# Patient Record
Sex: Male | Born: 1935
Health system: Southern US, Community
[De-identification: ages and names within clinical notes are randomized; demographics above are authoritative.]

## PROBLEM LIST (undated history)

## (undated) DIAGNOSIS — N320 Bladder-neck obstruction: Secondary | ICD-10-CM

## (undated) DIAGNOSIS — R399 Unspecified symptoms and signs involving the genitourinary system: Secondary | ICD-10-CM

## (undated) DIAGNOSIS — Z8679 Personal history of other diseases of the circulatory system: Secondary | ICD-10-CM

## (undated) DIAGNOSIS — E785 Hyperlipidemia, unspecified: Secondary | ICD-10-CM

## (undated) DIAGNOSIS — I10 Essential (primary) hypertension: Secondary | ICD-10-CM

## (undated) DIAGNOSIS — Z860100 Personal history of colon polyps, unspecified: Secondary | ICD-10-CM

## (undated) DIAGNOSIS — N183 Chronic kidney disease, stage 3 unspecified: Secondary | ICD-10-CM

## (undated) DIAGNOSIS — N400 Enlarged prostate without lower urinary tract symptoms: Secondary | ICD-10-CM

## (undated) DIAGNOSIS — Z8719 Personal history of other diseases of the digestive system: Secondary | ICD-10-CM

## (undated) DIAGNOSIS — Z9289 Personal history of other medical treatment: Secondary | ICD-10-CM

## (undated) DIAGNOSIS — Z87448 Personal history of other diseases of urinary system: Secondary | ICD-10-CM

## (undated) DIAGNOSIS — R3912 Poor urinary stream: Secondary | ICD-10-CM

## (undated) DIAGNOSIS — Z87442 Personal history of urinary calculi: Secondary | ICD-10-CM

## (undated) DIAGNOSIS — M51369 Other intervertebral disc degeneration, lumbar region without mention of lumbar back pain or lower extremity pain: Secondary | ICD-10-CM

## (undated) DIAGNOSIS — Z8711 Personal history of peptic ulcer disease: Secondary | ICD-10-CM

## (undated) DIAGNOSIS — M5136 Other intervertebral disc degeneration, lumbar region: Secondary | ICD-10-CM

## (undated) HISTORY — PX: CATARACT EXTRACTION W/ INTRAOCULAR LENS  IMPLANT, BILATERAL: SHX1307

## (undated) HISTORY — PX: COLONOSCOPY: SHX174

## (undated) HISTORY — PX: PROSTATE SURGERY: SHX751

---
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
## 1959-05-26 HISTORY — PX: ADENOIDECTOMY: SHX5191

## 1963-09-25 HISTORY — PX: NASAL SEPTUM SURGERY: SHX37

## 2004-09-20 ENCOUNTER — Ambulatory Visit: Payer: Self-pay | Admitting: Urology

## 2004-09-20 ENCOUNTER — Other Ambulatory Visit: Payer: Self-pay

## 2004-09-24 HISTORY — PX: HEMORRHOID SURGERY: SHX153

## 2004-09-27 ENCOUNTER — Ambulatory Visit: Payer: Self-pay | Admitting: Urology

## 2005-02-23 ENCOUNTER — Ambulatory Visit: Payer: Self-pay | Admitting: Gastroenterology

## 2005-11-05 ENCOUNTER — Ambulatory Visit: Payer: Self-pay | Admitting: Internal Medicine

## 2006-03-20 ENCOUNTER — Ambulatory Visit: Payer: Self-pay | Admitting: *Deleted

## 2006-03-28 ENCOUNTER — Ambulatory Visit: Payer: Self-pay | Admitting: *Deleted

## 2006-05-01 ENCOUNTER — Ambulatory Visit (HOSPITAL_COMMUNITY): Admission: RE | Admit: 2006-05-01 | Discharge: 2006-05-01 | Payer: Self-pay | Admitting: *Deleted

## 2006-05-06 ENCOUNTER — Ambulatory Visit: Payer: Self-pay | Admitting: *Deleted

## 2006-05-08 ENCOUNTER — Inpatient Hospital Stay (HOSPITAL_COMMUNITY): Admission: RE | Admit: 2006-05-08 | Discharge: 2006-05-10 | Payer: Self-pay | Admitting: Urology

## 2006-05-08 ENCOUNTER — Encounter (INDEPENDENT_AMBULATORY_CARE_PROVIDER_SITE_OTHER): Payer: Self-pay | Admitting: *Deleted

## 2006-05-08 HISTORY — PX: TRANSURETHRAL RESECTION OF PROSTATE: SHX73

## 2007-12-03 ENCOUNTER — Ambulatory Visit: Payer: Self-pay | Admitting: Pain Medicine

## 2007-12-09 ENCOUNTER — Ambulatory Visit: Payer: Self-pay | Admitting: Pain Medicine

## 2007-12-16 ENCOUNTER — Ambulatory Visit: Payer: Self-pay | Admitting: Pain Medicine

## 2007-12-31 ENCOUNTER — Ambulatory Visit: Payer: Self-pay | Admitting: Physician Assistant

## 2008-05-24 IMAGING — NM NM KIDNEY FUNCTION AND FLOW
2 series · 12 of 12 positions shown · non-contrast
Comparison: none

REASON FOR EXAM: renal artery stenosis
COMMENTS:

[Series 1000: renal · 7.79mm/px · 6 of 60 frames shown]
[frame 6/60]
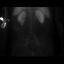
[frame 16/60]
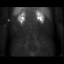
[frame 26/60]
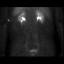
[frame 36/60]
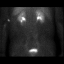
[frame 46/60]
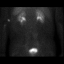
[frame 56/60]
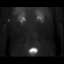

[Series 1000: renal (results) · 7.79mm/px · 6 of 60 frames shown]
[frame 6/60]
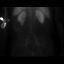
[frame 16/60]
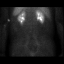
[frame 26/60]
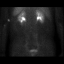
[frame 36/60]
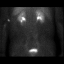
[frame 46/60]
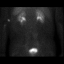
[frame 56/60]
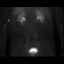

[12 of 12 positions shown; findings below may reference images not displayed]

PROCEDURE:     NM  - NM KIDNEY SCAN W  VASCULAR FLOW  - March 28, 2006 [DATE]

RESULT:     Following injection of 15.5 mCi of technetium-33m DTPA, renal
scan with renal function determination was performed. The renal scan shows
prompt appearance of maximum tracer in each kidney in less than 1 minute.
The washout curves bilaterally are flattened, which is compatible with
diminished renal function or bilateral stasis.  Split renal function
determination shows 40.6% of total renal function on the LEFT and 59.4% on
the RIGHT.
IMPRESSION: 1)Please see above.

## 2009-05-27 ENCOUNTER — Encounter: Admission: RE | Admit: 2009-05-27 | Discharge: 2009-05-27 | Payer: Self-pay | Admitting: Neurosurgery

## 2009-09-08 ENCOUNTER — Encounter: Admission: RE | Admit: 2009-09-08 | Discharge: 2009-09-08 | Payer: Self-pay | Admitting: Neurosurgery

## 2010-02-07 ENCOUNTER — Encounter: Admission: RE | Admit: 2010-02-07 | Discharge: 2010-02-07 | Payer: Self-pay | Admitting: Neurosurgery

## 2010-06-01 ENCOUNTER — Encounter: Admission: RE | Admit: 2010-06-01 | Discharge: 2010-06-01 | Payer: Self-pay | Admitting: Neurosurgery

## 2010-09-21 ENCOUNTER — Encounter
Admission: RE | Admit: 2010-09-21 | Discharge: 2010-09-21 | Payer: Self-pay | Source: Home / Self Care | Attending: Neurosurgery | Admitting: Neurosurgery

## 2011-02-09 NOTE — H&P (Signed)
Stephen, Wheeler                ACCOUNT NO.:  [id number]   MEDICAL RECORD NO.:  [id number]          PATIENT TYPE:  INP   LOCATION:  1420                         FACILITY[facility]  [facility]   PHYSICIAN:  Maretta Bees. Vonita Moss, M.D.DATE OF BIRTH:  05-11-1935   DATE OF ADMISSION:  05/08/2006  DATE OF DISCHARGE:                                HISTORY & PHYSICAL   HISTORY OF PRESENT ILLNESS:  This 76 year old gentleman has had problems  with stones and bladder outlet obstruction symptoms for years and has been  on Proscar and terazosin.  He has bleeding sometimes when he rides the  lawnmower.  He had a TUNA of his prostate by Dr. Virl Diamond in [place].  He  came to me for further evaluation.   He underwent office cystoscopy and was found to have a lot of calcifications  in the prostatic mucosa.  Initial stone analysis showed calcium phosphate  dihydrate with some 2,8-dihydroxyadenine which could be an enzymatic effect  but a repeat stone analysis did not show that, it just showed some urates  and proteinaceous matter in addition to the calcium hydrogen phosphate  dihydrate.   Because of his chronic voiding symptoms, bleeding and stones, he is admitted  for TUR of the prostate.  Fortunately, he had a normal PSA earlier this year  at 1.08 and doubling it, which make it 2.16.  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   MEDICAL HISTORY:  1. Mild hypertension.  2. Occasional irregular heartbeat.   PREVIOUS SURGERY:  1. TUNA of the prostate.  2. Appendectomy.  3. Hemorrhoidectomy.  4. Nasal septoplasty.   He had some recent creatinine elevation and had an angiogram that apparently  showed normal vascular flow.   MEDICATIONS:  1. 25 mg of Toprol.  2. 25 mg of __________.  3. 5 mg of terazosin.  4. 100 mg of Tramadol for p.r.n. arthritis.  5. 81 mg aspirin.  6. Multivitamins.  7. Glucosamine.   ALLERGIES TO DRUGS:  Denied.   SOCIAL  HISTORY:  He does not smoke or drink alcohol.   FAMILY HISTORY:  Noncontributory.   REVIEW OF SYSTEMS:  Noted by me and initialed on health history form.   PHYSICAL EXAMINATION:  VITAL SIGNS:  Blood pressure on admission was 134/78.  Pulse of 64.  Temperature of 98.7.  GENERAL:  He is alert and oriented.  SKIN:  Warm and dry.  In no acute distress.  HEART:  Heart tones are regular.  CHEST:  Clear.  BACK:  No CVA or bladder tenderness.  ABDOMEN:  No hepatosplenomegaly.  No hernias or inguinal nodes.  Exam of  genitalia show no urethral, penile, scrotal or epididymal lesions,but did  reveal an atrophic right testicle.  RECTAL:  Exam deferred.   IMPRESSION:  1. Bladder neck contracture.  2. Prostatic calculi.  3. Left renal cyst.  4. Right renal angiomyolipoma.  5. Atrophic right testicle.  6. Hypertension.  7. Mild renal insufficiency.  8. History of cardiac irregularity.      Maretta Bees. Vonita Moss, M.D.  Electronically Signed     LJP/MEDQ  D:  05/09/2006  T:  05/09/2006  Job:  [id number]

## 2011-02-09 NOTE — Op Note (Signed)
[name], [name]                ACCOUNT NO.:  [id number]   MEDICAL RECORD NO.:  [id number]          PATIENT TYPE:  INP   LOCATION:  1420                         FACILITY:  [facility]   PHYSICIAN:  Maretta Bees. Vonita Moss, M.D.DATE OF BIRTH:  [birth year]   DATE OF PROCEDURE:  05/08/2006  DATE OF DISCHARGE:                                 OPERATIVE REPORT   PREOPERATIVE DIAGNOSIS:  Bladder neck contracture and bladder calculi.   POSTOPERATIVE DIAGNOSIS:  Bladder neck contracture and bladder calculi.   PROCEDURE:  Transurethral resection of the prostate and EHL of prostatic  calculi.   SURGEON:  Dr. Larey Dresser.   ANESTHESIA:  General.   INDICATIONS:  This gentleman presents with a history of recurrent bladder  irritability, and a previous TUNA procedure at an outside facility, and has  had some persistent pyuria.  Cystoscopy in the office revealed a lot of  prostatic calcifications, and he is brought to the OR today for evaluation  and therapy of residual obstructive tissue and prostatic calculi.   PROCEDURE:  The patient was brought to the operating room and placed in  lithotomy position.  External genitalia were prepped and draped in the usual  fashion.  He was sounded from 18 to 30-French.  Resectoscope sheath was  inserted.  Prostate had a lot of mucosal calculi, especially on the left  side.  Some were quite large and were manipulated free into the bladder.  The bladder itself had no stones, tumors, inflammatory lesions.  I then  resected the residual prostatic tissue bilaterally, and there was mainly  tissue on the anterior wall and the right wall, but both sides were taken  down to capsule.  A lot of calcifications embedded in the bladder neck were  resected and freed up.  At the conclusion of the procedure the prostate was  well resected with no residual calculi in the prostatic urethra, but after  removing the chips from the bladder, the residual stone fragments in the  bladder, that had been in the prostate, required EHL to fragment them in  smaller pieces so they could be washed out through the resectoscope sheath.  After that was complete there was no evidence of residual chips or stones in  the bladder, ureteral orifices were intact, prostatic urethra had good  hemostasis, estimated blood loss was 100 cc.  As I removed the scope, the  external sphincter was seen to be intact, and then a 24-French, 30-cc Foley  was inserted and connected to closed drainage with clear irrigation.  Catheter was put on traction and he was sent to the recovery room in good  condition, having tolerated the procedure well.      Maretta Bees. Vonita Moss, M.D.  Electronically Signed     LJP/MEDQ  D:  05/08/2006  T:  05/08/2006  Job:  [id number]

## 2011-02-09 NOTE — Discharge Summary (Signed)
[name], [name]                ACCOUNT NO.:  [id number]   MEDICAL RECORD NO.:  [id number]          PATIENT TYPE:  INP   LOCATION:  1420                         FACILITY:  [facility]   PHYSICIAN:  Maretta Bees. Vonita Moss, M.D.DATE OF BIRTH:  1935-03-06   DATE OF ADMISSION:  05/08/2006  DATE OF DISCHARGE:  05/10/2006                                 DISCHARGE SUMMARY   FINAL DIAGNOSES:  1. Bladder neck contracture.  2. Prostatic calculi.  3. Left renal cyst.  4. Right renal angiomyelolipoma.  5. Atrophic right testicle.  6. Hypertension.  7. Mild renal insufficiency.  8. Cardiac irregularity.   PROCEDURE:  Transurethral resection of prostate and EHL of prostatic calculi  on May 08, 2006.   HISTORY:  This 76 year old gentleman has had problems with bladder outlet  obstruction and has been on Proscar and terazosin and has had a TUNA  procedure in [place]. He has been passing stones. Cystoscopy revealed a  partially obstructed prostate with a lot of prostatic calcifications that I  believe are the cause for bleeding and voiding dysfunction. He is brought to  the hospital for a TUR of the prostate and removal of these prostate  calculi. He is in general good health except for well-controlled mild  hypertension and occasional irregular heartbeat. Physical examination is  noncontributory.   HOSPITAL COURSE:  He was admitted after undergoing the procedure as noted  above. His postoperative course was benign and he did have some bloody  urine, initially some bladder spasms but the urine started clearing. His  Foley was removed on May 10, 2006 and he voided satisfactorily after that  and was ready for discharge. He will go home on his usual medications of 25  mg of metoprolol, 5 mg finasteride, 5 mg terazosin, 100 mg tramadol at  bedtime, 81 mg aspirin which he can start in a few days. He will also go  home on a 1/2 gram of Cipro b.i.d. for 5 days. He will have limited activity  for a  month. He will resume diet as tolerated. He was sent home in good  condition. He will return to the office in 2 weeks for followup.      Maretta Bees. Vonita Moss, M.D.  Electronically Signed     LJP/MEDQ  D:  05/10/2006  T:  05/10/2006  Job:  [id number]

## 2011-06-14 ENCOUNTER — Other Ambulatory Visit: Payer: Self-pay | Admitting: Internal Medicine

## 2011-06-14 DIAGNOSIS — M5136 Other intervertebral disc degeneration, lumbar region: Secondary | ICD-10-CM

## 2011-06-18 ENCOUNTER — Ambulatory Visit
Admission: RE | Admit: 2011-06-18 | Discharge: 2011-06-18 | Disposition: A | Discharge status: Home / Self Care | Payer: No Typology Code available for payment source | Source: Ambulatory Visit | Attending: Internal Medicine | Admitting: Internal Medicine

## 2011-06-18 DIAGNOSIS — M5136 Other intervertebral disc degeneration, lumbar region: Secondary | ICD-10-CM

## 2011-06-18 MED ORDER — METHYLPREDNISOLONE ACETATE 40 MG/ML INJ SUSP (RADIOLOG
120.0000 mg | Freq: Once | INTRAMUSCULAR | Status: AC
  Administered 2011-06-18: 120 mg via EPIDURAL

## 2011-06-18 MED ORDER — IOHEXOL 180 MG/ML  SOLN
1.0000 mL | Freq: Once | INTRAMUSCULAR | Status: AC | PRN
  Administered 2011-06-18: 1 mL via EPIDURAL

## 2012-02-23 HISTORY — PX: CATARACT EXTRACTION W/ INTRAOCULAR LENS  IMPLANT, BILATERAL: SHX1307

## 2012-05-19 ENCOUNTER — Encounter: Payer: Self-pay | Admitting: Rheumatology

## 2012-05-25 ENCOUNTER — Encounter: Payer: Self-pay | Admitting: Rheumatology

## 2012-06-24 ENCOUNTER — Encounter: Payer: Self-pay | Admitting: Rheumatology

## 2012-08-12 ENCOUNTER — Other Ambulatory Visit: Payer: Self-pay | Admitting: Neurosurgery

## 2012-08-12 DIAGNOSIS — IMO0002 Reserved for concepts with insufficient information to code with codable children: Secondary | ICD-10-CM

## 2012-08-16 ENCOUNTER — Ambulatory Visit
Admission: RE | Admit: 2012-08-16 | Discharge: 2012-08-16 | Disposition: A | Discharge status: Home / Self Care | Payer: Medicare Other | Source: Ambulatory Visit | Attending: Neurosurgery | Admitting: Neurosurgery

## 2012-08-16 DIAGNOSIS — IMO0002 Reserved for concepts with insufficient information to code with codable children: Secondary | ICD-10-CM

## 2012-09-03 ENCOUNTER — Encounter: Payer: Self-pay | Admitting: Neurosurgery

## 2012-09-24 ENCOUNTER — Encounter: Payer: Self-pay | Admitting: Neurosurgery

## 2013-01-28 ENCOUNTER — Ambulatory Visit: Payer: Self-pay | Admitting: Surgery

## 2013-01-29 LAB — PATHOLOGY REPORT

## 2014-01-05 DIAGNOSIS — M76899 Other specified enthesopathies of unspecified lower limb, excluding foot: Secondary | ICD-10-CM | POA: Insufficient documentation

## 2014-01-05 DIAGNOSIS — I10 Essential (primary) hypertension: Secondary | ICD-10-CM | POA: Insufficient documentation

## 2014-07-07 DIAGNOSIS — H81319 Aural vertigo, unspecified ear: Secondary | ICD-10-CM | POA: Insufficient documentation

## 2014-07-07 DIAGNOSIS — M5116 Intervertebral disc disorders with radiculopathy, lumbar region: Secondary | ICD-10-CM | POA: Insufficient documentation

## 2014-07-07 DIAGNOSIS — E785 Hyperlipidemia, unspecified: Secondary | ICD-10-CM | POA: Insufficient documentation

## 2014-10-27 DIAGNOSIS — M5136 Other intervertebral disc degeneration, lumbar region: Secondary | ICD-10-CM | POA: Insufficient documentation

## 2015-01-14 NOTE — Op Note (Signed)
PATIENT NAME:  [name], [name] MR#:  [id number] DATE OF BIRTH:  Jun 08, 1935  DATE OF PROCEDURE:  01/28/2013  PREOPERATIVE DIAGNOSES: History of rectal bleeding, hemorrhoids, constipation.   POSTOPERATIVE DIAGNOSES: History of rectal bleeding, hemorrhoids, constipation; colonic polyp.   PROCEDURE: Colonoscopy and polypectomy.   SURGEON: Rochel Brome, M.D.   ANESTHESIA: Monitored anesthesia care with intravenous sedation.   The patient was placed on the operating table in the supine position and sedated, rolled up into the left lateral decubitus position. He was monitored with pulse oximetry, intermittent blood pressure recordings, and cardiac monitor, sedated by the anesthesia staff.   The video colonoscope was inserted through the rectum and manipulated up through the colon around to the ascending colon, and it appeared that the scope was entering the right lower quadrant. There was marked tortuosity of the colon and I did not identify the cecum that appeared to be in the ascending colon. There was a polyp encountered in the ascending colon which was partially pedunculated. It appeared that it may be about 8 or 9 mm in dimension. Initially a biopsy was taken and subsequently it was removed with snare and cautery. The colon preparation was good. Some bilious fluid was aspirated during the course of the procedure.   The scope was gradually pulled back, examining the colonic mucosa, seeing no other polyps or tumors. Scope was pulled back into the rectum and retroflexed. There were a few areas of hyperplasia and one internal hemorrhoid was identified. No large polyps. No blood. The scope was again straightened and suctioned out excess carbon dioxide. The scope was removed.   A digital anorectal exam demonstrated no palpable mass.   The patient appeared to be in satisfactory condition and was prepared for transfer to the recovery room.    ____________________________ Lenna Sciara. Rochel Brome,  MD jws:dm D: 01/28/2013 09:08:32 ET T: 01/28/2013 09:22:35 ET JOB#: [id number]  cc: Loreli Dollar, MD, <Dictator> Loreli Dollar MD ELECTRONICALLY SIGNED 01/29/2013 17:20

## 2015-01-28 DIAGNOSIS — J329 Chronic sinusitis, unspecified: Secondary | ICD-10-CM | POA: Diagnosis not present

## 2015-01-28 DIAGNOSIS — J029 Acute pharyngitis, unspecified: Secondary | ICD-10-CM | POA: Diagnosis not present

## 2015-01-28 DIAGNOSIS — B9689 Other specified bacterial agents as the cause of diseases classified elsewhere: Secondary | ICD-10-CM | POA: Diagnosis not present

## 2015-03-11 ENCOUNTER — Telehealth: Payer: Self-pay | Admitting: Family Medicine

## 2015-03-11 ENCOUNTER — Telehealth: Payer: Self-pay

## 2015-03-11 NOTE — Telephone Encounter (Signed)
Dr. Sharlet Salina office called back and notified me that he actually has [employer] although in our old system it was [employer]. I went in the [place] and pulled up his information and obtained authorization for pt to been seen by Dr. Sharlet Salina. Auth # [id number],  03/11/15 -09/07/15.  Once they receive records from [place] they'll call pt and schedule appt.

## 2015-03-11 NOTE — Telephone Encounter (Signed)
[facility] to get clarification on what exactly they needed and the receptionist stated that they just needed our NPI number and I gave our group NPI # and authorized 6 visits. They'll call [name] notify him that he may schedule his injections.

## 2015-03-11 NOTE — Telephone Encounter (Signed)
Pt was being referred to [facility]. They are requesting a note that states that the patient nned an Epideral Steriod Injection in the back for pain.

## 2015-04-05 ENCOUNTER — Ambulatory Visit (INDEPENDENT_AMBULATORY_CARE_PROVIDER_SITE_OTHER): Payer: Medicare Other | Admitting: Family Medicine

## 2015-04-05 ENCOUNTER — Encounter: Payer: Self-pay | Admitting: Family Medicine

## 2015-04-05 ENCOUNTER — Telehealth: Payer: Self-pay

## 2015-04-05 VITALS — BP 112/58 | HR 65 | Temp 98.4°F | Resp 16 | Ht 70.0 in | Wt 187.0 lb

## 2015-04-05 DIAGNOSIS — N401 Enlarged prostate with lower urinary tract symptoms: Secondary | ICD-10-CM | POA: Insufficient documentation

## 2015-04-05 DIAGNOSIS — B9689 Other specified bacterial agents as the cause of diseases classified elsewhere: Secondary | ICD-10-CM | POA: Insufficient documentation

## 2015-04-05 DIAGNOSIS — N183 Chronic kidney disease, stage 3 unspecified: Secondary | ICD-10-CM | POA: Insufficient documentation

## 2015-04-05 DIAGNOSIS — G4701 Insomnia due to medical condition: Secondary | ICD-10-CM | POA: Diagnosis not present

## 2015-04-05 DIAGNOSIS — M5136 Other intervertebral disc degeneration, lumbar region: Secondary | ICD-10-CM | POA: Diagnosis not present

## 2015-04-05 DIAGNOSIS — J069 Acute upper respiratory infection, unspecified: Secondary | ICD-10-CM

## 2015-04-05 DIAGNOSIS — I10 Essential (primary) hypertension: Secondary | ICD-10-CM | POA: Insufficient documentation

## 2015-04-05 DIAGNOSIS — M1990 Unspecified osteoarthritis, unspecified site: Secondary | ICD-10-CM | POA: Insufficient documentation

## 2015-04-05 DIAGNOSIS — Z23 Encounter for immunization: Secondary | ICD-10-CM | POA: Diagnosis not present

## 2015-04-05 DIAGNOSIS — G4700 Insomnia, unspecified: Secondary | ICD-10-CM | POA: Insufficient documentation

## 2015-04-05 DIAGNOSIS — E785 Hyperlipidemia, unspecified: Secondary | ICD-10-CM | POA: Diagnosis not present

## 2015-04-05 DIAGNOSIS — Z0000 Encounter for general adult medical examination without abnormal findings: Secondary | ICD-10-CM

## 2015-04-05 DIAGNOSIS — J029 Acute pharyngitis, unspecified: Secondary | ICD-10-CM | POA: Insufficient documentation

## 2015-04-05 DIAGNOSIS — Z87898 Personal history of other specified conditions: Secondary | ICD-10-CM | POA: Insufficient documentation

## 2015-04-05 DIAGNOSIS — M6530 Trigger finger, unspecified finger: Secondary | ICD-10-CM | POA: Insufficient documentation

## 2015-04-05 MED ORDER — ALPRAZOLAM 0.5 MG PO TABS: 0.5000 mg | ORAL_TABLET | Freq: Every evening | ORAL | Status: DC | PRN

## 2015-04-05 MED ORDER — TAMSULOSIN HCL 0.4 MG PO CAPS: 0.4000 mg | ORAL_CAPSULE | Freq: Every day | ORAL | Status: DC

## 2015-04-05 MED ORDER — LISINOPRIL 20 MG PO TABS: 20.0000 mg | ORAL_TABLET | Freq: Every day | ORAL | Status: DC

## 2015-04-05 MED ORDER — TRAMADOL HCL 50 MG PO TABS: 100.0000 mg | ORAL_TABLET | Freq: Three times a day (TID) | ORAL | Status: AC | PRN

## 2015-04-05 NOTE — Addendum Note (Signed)
Addended by: Bobetta Lime on: 04/05/2015 01:48 PM   Modules accepted: Level of Service, SmartSet

## 2015-04-05 NOTE — Progress Notes (Signed)
Name: [name]   MRN: [id number]    DOB: 06/07/1935   Date:04/05/2015       Progress Note  Subjective  Chief Complaint  Chief Complaint  Patient presents with  . Annual Exam  . Medication Refill  . Fatigue    HPI  Patient is here today for a Male Medicare Wellness Visit:  The patient has been in otherwise good general health and voices some concerns regarding tendency for low BPs during hot summer months. Also notes fatigue from his tamsulosin. Planning on returning to urologist (near [place]) to discuss prostate procedure this Fall. Needs refills of all of his medications sent to mail order pharmacy.  Hypertension: Patient is here for evaluation of low blood pressures.  Age at onset of elevated blood pressure:  many decades ago.Cardiac symptoms fatigue. Patient denies chest pain, chest pressure/discomfort, dyspnea, irregular heart beat, lower extremity edema, near-syncope, palpitations and tachypnea.  Cardiovascular risk factors: advanced age (older than 9 for men, 19 for women), hypertension and male gender. Use of agents associated with hypertension: NSAIDS. History of target organ damage: none.    Joint/Muscle Pain: Patient complains of arthralgias for which has been present for several years. Pain is located in back, is described as aching, and is intermittent, moderate .  Associated symptoms include: none.  The patient has been using ibuprofen 600mg  po up to tid prn. Also prescribed tramadol 50mg  2 po q8hrs.  Related to injury:   no.      Past Medical History  Diagnosis Date  . Hypertension, goal below 150/90   . Benign localized prostatic hyperplasia with lower urinary tract symptoms (LUTS)   . Insomnia due to medical condition   . Trigger finger of right hand   . Degeneration of intervertebral disc of lumbar region     Past Surgical History  Procedure Laterality Date  . Appendectomy  1957  . Rhinoplasty  1965  . Hemorrhoid surgery      patient states he had  this about 9-10 years ago  . Prostate surgery      Family History  Problem Relation Age of Onset  . Cancer Sister     breast  . Cancer Brother     prostate    History   Social History  . Marital Status: Married    Spouse Name: N/A  . Number of Children: 1  . Years of Education: N/A   Occupational History  . retired    Social History Main Topics  . Smoking status: Former Research scientist (life sciences)  . Smokeless tobacco: Not on file  . Alcohol Use: No  . Drug Use: No  . Sexual Activity: No   Other Topics Concern  . Not on file   Social History Narrative  . No narrative on file     Current outpatient prescriptions:  .  ALPRAZolam (XANAX) 0.5 MG tablet, Take by mouth., Disp: , Rfl:  .  aspirin 81 MG chewable tablet, Chew by mouth., Disp: , Rfl:  .  BOSWELLIA-GLUCOSAMINE-VIT D PO, Take by mouth., Disp: , Rfl:  .  Fish Oil-Cholecalciferol (FISH OIL + D3 PO), , Disp: , Rfl:  .  Misc Natural Products (SAW [place]METTO) CAPS, Take by mouth., Disp: , Rfl:  .  tamsulosin (FLOMAX) 0.4 MG CAPS capsule, Take by mouth., Disp: , Rfl:  .  traMADol (ULTRAM) 50 MG tablet, Take by mouth., Disp: , Rfl:  .  triamterene-hydrochlorothiazide (MAXZIDE-25) 37.5-25 MG per tablet, Take by mouth., Disp: , Rfl:  No Known Allergies  Fall Risk: Fall Risk  04/05/2015  Falls in the past year? Yes  Number falls in past yr: 2 or more  Injury with Fall? No  Follow up Education provided;Falls prevention discussed;Falls evaluation completed    Depression screen [facility] 2/9 04/05/2015  Decreased Interest 0  Down, Depressed, Hopeless 0  PHQ - 2 Score 0   Functional Status Survey: Is the patient deaf or have difficulty hearing?: No Does the patient have difficulty seeing, even when wearing glasses/contacts?: No Does the patient have difficulty concentrating, remembering, or making decisions?: No Does the patient have difficulty walking or climbing stairs?: No Does the patient have difficulty dressing or bathing?:  No Does the patient have difficulty doing errands alone such as visiting a doctor's office or shopping?: No   ROS  CONSTITUTIONAL: No significant weight changes, fever, chills, weakness or fatigue.  HEENT:  - Eyes: No visual changes.  - Ears: No auditory changes. No pain.  - Nose: No sneezing, congestion, runny nose. - Throat: No sore throat. No changes in swallowing. SKIN: No rash or itching.  CARDIOVASCULAR: No chest pain, chest pressure or chest discomfort. No palpitations or edema.  RESPIRATORY: No shortness of breath, cough or sputum.  GASTROINTESTINAL: No anorexia, nausea, vomiting. No changes in bowel habits. No abdominal pain or blood.  GENITOURINARY: No dysuria. No frequency. No discharge.  NEUROLOGICAL: No headache, dizziness, syncope, paralysis, ataxia, numbness or tingling in the extremities. No memory changes. No change in bowel or bladder control.  MUSCULOSKELETAL: No joint pain. No muscle pain. HEMATOLOGIC: No anemia, bleeding or bruising.  LYMPHATICS: No enlarged lymph nodes.  PSYCHIATRIC: No change in mood. No change in sleep pattern.  ENDOCRINOLOGIC: No reports of sweating, cold or heat intolerance. No polyuria or polydipsia.   Objective  Filed Vitals:   04/05/15 0842  BP: 112/58  Pulse: 65  Temp: 98.4 F (36.9 C)  TempSrc: Oral  Resp: 16  Height: 5\' 10"  (1.778 m)  Weight: 187 lb (84.823 kg)  SpO2: 98%   Body mass index is 26.83 kg/(m^2).  Physical Exam  Constitutional: Patient appears well-developed and well-nourished. In no distress.  HEENT:  - Head: Normocephalic and atraumatic.  - Ears: Bilateral TMs gray, no erythema or effusion - Nose: Nasal mucosa moist - Mouth/Throat: Oropharynx is clear and moist. No tonsillar hypertrophy or erythema. No post nasal drainage.  - Eyes: Conjunctivae clear, EOM movements normal. PERRLA. No scleral icterus.  Neck: Normal range of motion. Neck supple. No JVD present. No thyromegaly present.  Cardiovascular:  Normal rate, regular rhythm and normal heart sounds.  No murmur heard.  Pulmonary/Chest: Effort normal and breath sounds normal. No respiratory distress. Musculoskeletal: Normal range of motion bilateral UE and LE, no joint effusions. Peripheral vascular: Bilateral LE no edema. Neurological: CN II-XII grossly intact with no focal deficits. Alert and oriented to person, place, and time. Coordination, balance, strength, speech and gait are normal.  Skin: Skin is warm and dry. No rash noted. Distributed actinic keratosis and sun spots.  Psychiatric: Patient has a normal mood and affect. Behavior is normal in office today. Judgment and thought content normal in office today.   Assessment & Plan  1. Annual physical exam Functional ability/safety issues: No Issues Hearing issues: Addressed  Activities of daily living: Discussed Home safety issues: No Issues  End Of Life Planning: Offered verbal information regarding advanced directives, healthcare power of attorney.  Preventative care, Health maintenance, Preventative health measures discussed.  Preventative screenings discussed today: lab  work, colonoscopy, PSA.  Men age 72 to 69 years if ever smoked recommended to get a one time AAA ultrasound screening exam.  Low Dose CT Chest recommended if Age 39-80 years, 30 pack-year currently smoking OR have quit w/in 15years.   Lifestyle risk factor issued reviewed: Diet, exercise, weight management, advised patient smoking is not healthy, nutrition/diet.  Preventative health measures discussed (5-10 year plan).  Reviewed and recommended vaccinations: - Pneumovax  - Prevnar  - Annual Influenza - Zostavax - Tdap   Depression screening: Done Fall risk screening: Done Discuss ADLs/IADLs: Done  Current medical providers: See HPI  Other health risk factors identified this visit: No other issues Cognitive impairment issues: None identified  All above discussed with patient. Appropriate  education, counseling and referral will be made based upon the above.   2. Chronic kidney disease (CKD), stage III (moderate) Due for blood work. Using NSAIDs chronically needs close monitoring.  - CBC with Differential/Platelet - Comprehensive metabolic panel  3. DDD (degenerative disc disease), lumbar Refilled, printed and will be faxed to mail order per his request.  - traMADol (ULTRAM) 50 MG tablet; Take 2 tablets (100 mg total) by mouth every 8 (eight) hours as needed for severe pain.  Dispense: 540 tablet; Refill: 2  4. Hypertension goal BP (blood pressure) < 150/90 Due to low BPs will stop triamterene-hctz and put him back on lisinopril 20mg  one a day (he was on this previously).  - lisinopril (PRINIVIL,ZESTRIL) 20 MG tablet; Take 1 tablet (20 mg total) by mouth daily.  Dispense: 90 tablet; Refill: 3 - CBC with Differential/Platelet - Comprehensive metabolic panel - TSH  5. Hyperlipidemia LDL goal <100 Due for lab work.  - Lipid panel  6. Insomnia due to medical condition Refilled  - ALPRAZolam (XANAX) 0.5 MG tablet; Take 1 tablet (0.5 mg total) by mouth at bedtime as needed for anxiety.  Dispense: 90 tablet; Refill: 2  7. Benign prostatic hypertrophy with lower urinary tract symptoms (LUTS) Continue care with Urology specialist  - tamsulosin (FLOMAX) 0.4 MG CAPS capsule; Take 1 capsule (0.4 mg total) by mouth daily.  Dispense: 90 capsule; Refill: 2 - PSA  8. Need for diphtheria-tetanus-pertussis (Tdap) vaccine, adult/adolescent  - Tdap vaccine greater than or equal to 7yo IM  9. Need for pneumococcal vaccination  - Pneumococcal conjugate vaccine 13-valent

## 2015-04-06 LAB — COMPREHENSIVE METABOLIC PANEL
ALK PHOS: 70 IU/L (ref 39–117)
ALT: 36 IU/L (ref 0–44)
AST: 30 IU/L (ref 0–40)
Albumin/Globulin Ratio: 1.6 (ref 1.1–2.5)
Albumin: 4.4 g/dL (ref 3.5–4.8)
BUN / CREAT RATIO: 17 (ref 10–22)
BUN: 21 mg/dL (ref 8–27)
Bilirubin Total: 0.5 mg/dL (ref 0.0–1.2)
CALCIUM: 9.9 mg/dL (ref 8.6–10.2)
CO2: 26 mmol/L (ref 18–29)
Chloride: 96 mmol/L — ABNORMAL LOW (ref 97–108)
Creatinine, Ser: 1.26 mg/dL (ref 0.76–1.27)
GFR calc Af Amer: 63 mL/min/{1.73_m2} (ref >59)
GFR calc non Af Amer: 55 mL/min/{1.73_m2} — ABNORMAL LOW (ref >59)
Globulin, Total: 2.8 g/dL (ref 1.5–4.5)
Glucose: 91 mg/dL (ref 65–99)
POTASSIUM: 4.9 mmol/L (ref 3.5–5.2)
Sodium: 139 mmol/L (ref 134–144)
Total Protein: 7.2 g/dL (ref 6.0–8.5)

## 2015-04-06 LAB — CBC WITH DIFFERENTIAL/PLATELET
BASOS ABS: 0.1 10*3/uL (ref 0.0–0.2)
BASOS: 1 %
EOS (ABSOLUTE): 0.4 10*3/uL (ref 0.0–0.4)
EOS: 8 %
HEMATOCRIT: 42.5 % (ref 37.5–51.0)
Hemoglobin: 13.8 g/dL (ref 12.6–17.7)
IMMATURE GRANULOCYTES: 0 %
Immature Grans (Abs): 0 10*3/uL (ref 0.0–0.1)
Lymphocytes Absolute: 1.7 10*3/uL (ref 0.7–3.1)
Lymphs: 35 %
MCH: 28.8 pg (ref 26.6–33.0)
MCHC: 32.5 g/dL (ref 31.5–35.7)
MCV: 89 fL (ref 79–97)
MONOS ABS: 0.6 10*3/uL (ref 0.1–0.9)
Monocytes: 11 %
NEUTROS ABS: 2.2 10*3/uL (ref 1.4–7.0)
NEUTROS PCT: 45 %
Platelets: 224 10*3/uL (ref 150–379)
RBC: 4.8 x10E6/uL (ref 4.14–5.80)
RDW: 14.2 % (ref 12.3–15.4)
WBC: 5 10*3/uL (ref 3.4–10.8)

## 2015-04-06 LAB — LIPID PANEL
Chol/HDL Ratio: 5.2 ratio units — ABNORMAL HIGH (ref 0.0–5.0)
Cholesterol, Total: 199 mg/dL (ref 100–199)
HDL: 38 mg/dL — AB (ref >39)
LDL Calculated: 129 mg/dL — ABNORMAL HIGH (ref 0–99)
Triglycerides: 162 mg/dL — ABNORMAL HIGH (ref 0–149)
VLDL CHOLESTEROL CAL: 32 mg/dL (ref 5–40)

## 2015-04-06 LAB — TSH: TSH: 1.91 u[IU]/mL (ref 0.450–4.500)

## 2015-04-06 LAB — PSA: PROSTATE SPECIFIC AG, SERUM: 1.9 ng/mL (ref 0.0–4.0)

## 2015-04-07 NOTE — Progress Notes (Signed)
Reviewed documentation patient declined statin therapy.

## 2015-04-19 ENCOUNTER — Other Ambulatory Visit: Payer: Self-pay | Admitting: Family Medicine

## 2015-04-19 DIAGNOSIS — G4701 Insomnia due to medical condition: Secondary | ICD-10-CM

## 2015-04-19 NOTE — Telephone Encounter (Signed)
PT SAID TO CANCEL THE REQUEST OF THE ALPRAZALM THAT THE MAIL ORDER WAS OUT OF THAT DRUG BUT NOW HAS IT AND IT IS ON THE WAY. DOES NOT NEED IT SENT TO THE LOCAL PHARM.

## 2015-04-19 NOTE — Telephone Encounter (Signed)
PT IS ASKING THAT WE CALL IN TO [facility] IN [place] HIS ALPRAZALM. MAIL ORDER IS NOT FILLING IT AND DOES KNOW WHY. ASKED IF THIS COULD BE DONE TODAY FOR HE HAS BEEN OUT FOR A WEEK NOW.

## 2015-04-19 NOTE — Telephone Encounter (Signed)
Refill request was sent to Dr. Ashany Sundaram for approval and submission.  

## 2015-04-19 NOTE — Telephone Encounter (Signed)
Noted  

## 2015-05-24 DIAGNOSIS — H26493 Other secondary cataract, bilateral: Secondary | ICD-10-CM | POA: Diagnosis not present

## 2015-06-01 DIAGNOSIS — R3912 Poor urinary stream: Secondary | ICD-10-CM | POA: Diagnosis not present

## 2015-06-01 DIAGNOSIS — N401 Enlarged prostate with lower urinary tract symptoms: Secondary | ICD-10-CM | POA: Diagnosis not present

## 2015-06-01 DIAGNOSIS — R350 Frequency of micturition: Secondary | ICD-10-CM | POA: Diagnosis not present

## 2015-06-08 DIAGNOSIS — M65331 Trigger finger, right middle finger: Secondary | ICD-10-CM | POA: Diagnosis not present

## 2015-06-20 DIAGNOSIS — R3912 Poor urinary stream: Secondary | ICD-10-CM | POA: Diagnosis not present

## 2015-06-20 DIAGNOSIS — R351 Nocturia: Secondary | ICD-10-CM | POA: Diagnosis not present

## 2015-06-20 DIAGNOSIS — R350 Frequency of micturition: Secondary | ICD-10-CM | POA: Diagnosis not present

## 2015-06-22 DIAGNOSIS — R350 Frequency of micturition: Secondary | ICD-10-CM | POA: Diagnosis not present

## 2015-06-22 DIAGNOSIS — N401 Enlarged prostate with lower urinary tract symptoms: Secondary | ICD-10-CM | POA: Diagnosis not present

## 2015-06-22 DIAGNOSIS — R3912 Poor urinary stream: Secondary | ICD-10-CM | POA: Diagnosis not present

## 2015-06-27 ENCOUNTER — Other Ambulatory Visit: Payer: Self-pay | Admitting: Urology

## 2015-08-09 DIAGNOSIS — R3912 Poor urinary stream: Secondary | ICD-10-CM | POA: Diagnosis not present

## 2015-08-09 DIAGNOSIS — R350 Frequency of micturition: Secondary | ICD-10-CM | POA: Diagnosis not present

## 2015-08-09 DIAGNOSIS — N401 Enlarged prostate with lower urinary tract symptoms: Secondary | ICD-10-CM | POA: Diagnosis not present

## 2015-08-16 ENCOUNTER — Encounter (HOSPITAL_BASED_OUTPATIENT_CLINIC_OR_DEPARTMENT_OTHER): Payer: Self-pay | Admitting: *Deleted

## 2015-08-17 ENCOUNTER — Encounter (HOSPITAL_BASED_OUTPATIENT_CLINIC_OR_DEPARTMENT_OTHER): Payer: Self-pay | Admitting: *Deleted

## 2015-08-17 NOTE — Progress Notes (Signed)
NPO AFTER MN.  ARRIVE AT 0600.  NEEDS ISTAT AND EKG.  

## 2015-08-22 NOTE — H&P (Signed)
History of Present Illness He presents today for a preoperative appointment before undergoing greenlight vaporization procedure with Dr. Junious Silk at the end of the month. At his last visit in September this plan of care was determined the patient was also trialed on Rapaflo.  He notes no improvement while taking the Rapaflo and has since stopped the medication. He continues with his previously documented lower urinary tract symptoms of frequency, urgency, and nocturia. There is not been any gross hematuria or dysuria. He denies any renal colic. There has not been any recent fevers or infections. He denies any recent chest pain or shortness of breath.   Past Medical History Problems  1. History of Arthritis 2. History of Asthma (J45.909) 3. History of Atrial fibrillation (I48.91) 4. History of Gastric ulcer (K25.9) 5. History of hepatitis (Z86.19) 6. History of hypercholesterolemia (Z86.39) 7. History of hypertension (Z86.79)  Surgical History Problems  1. History of Appendectomy 2. History of Cataract Surgery 3. History of Nose Surgery 4. History of Prostate Surgery  Current Meds 1. ALPRAZolam 0.5 MG Oral Tablet;  Therapy: [id number] to Recorded 2. Aspirin 81 MG TABS;  Therapy: (Recorded:11Dec2007) to Recorded 3. Fish Oil CAPS;  Therapy: (Recorded:11Dec2007) to Recorded 4. Glucosamine Chondroitin TABS;  Therapy: (Recorded:16Jul2014) to Recorded 5. Lisinopril 20 MG Oral Tablet;  Therapy: (Recorded:07Sep2016) to Recorded 6. Saw Palmetto TABS;  Therapy: (Recorded:16Jul2014) to Recorded 7. Tamsulosin HCl - 0.4 MG Oral Capsule;  Therapy: (Recorded:07Sep2016) to Recorded 8. TraMADol HCl - 50 MG Oral Tablet;  Therapy: (Recorded:16Jul2014) to Recorded  Allergies Medication  1. No Known Drug Allergies  Family History Problems  1. Family history of Abdominal Aortic Artery Aneurysm (___cm) : Father 2. Family history of Cardiac Failure : Mother 3. Family history of Congestive Heart  Failure : Mother 4. Family history of Death In The Family Father 5. Family history of Death In The Family Mother 77. Family history of Family Health Status Number Of Children   1 daughter 70. Family history of Hypertension 8. Family history of Prostate Cancer : Brother  Social History Problems  1. Denied: History of Alcohol Use (History) 2. Caffeine Use 3. Former smoker [phone])   smoked <1ppd for 82yrs quit 1966 4. Marital History - Currently Married 5. Retired From Work 65. History of Tobacco Use  Review of Systems Genitourinary, constitutional, skin, eye, otolaryngeal, hematologic/lymphatic, cardiovascular, pulmonary, endocrine, musculoskeletal, gastrointestinal, neurological and psychiatric system(s) were reviewed and pertinent findings if present are noted and are otherwise negative.  Genitourinary: urinary frequency, feelings of urinary urgency, nocturia, weak urinary stream and initiating urination requires straining.    Vitals Vital Signs [Data Includes: Last 1 Day]  Recorded: [id number] 10:02AM  Height: 5 ft 11 in Weight: 186 lb  BMI Calculated: 25.94 BSA Calculated: 2.04 Blood Pressure: 127 / 74 Temperature: 98 F Heart Rate: 56  Physical Exam Constitutional: Well nourished and well developed . No acute distress.  ENT:. The ears and nose are normal in appearance.  Neck: The appearance of the neck is normal and no neck mass is present.  Pulmonary: No respiratory distress and normal respiratory rhythm and effort.  Cardiovascular: Heart rate and rhythm are normal . No peripheral edema.  Abdomen: The abdomen is soft and nontender. No masses are palpated. No CVA tenderness. No hernias are palpable. No hepatosplenomegaly noted.  Lymphatics: The femoral and inguinal nodes are not enlarged or tender.  Skin: Normal skin turgor, no visible rash and no visible skin lesions.  Neuro/Psych:. Mood and affect are appropriate.  Results/Data Urine [Data Includes: Last 1 Day]    [id number]  COLOR YELLOW   APPEARANCE CLEAR   SPECIFIC GRAVITY 1.015   pH 7.0   GLUCOSE NEGATIVE   BILIRUBIN NEGATIVE   KETONE NEGATIVE   BLOOD NEGATIVE   PROTEIN NEGATIVE   NITRITE NEGATIVE   LEUKOCYTE ESTERASE NEGATIVE    The following clinical lab reports were reviewed:  Urinalysis is negative for hematuria or pyuria.    Assessment Assessed  1. Benign localized prostatic hyperplasia with lower urinary tract symptoms (LUTS) (N40.1)  Plan Benign localized prostatic hyperplasia with lower urinary tract symptoms (LUTS)  1. Follow-up Keep Future Appt Office  Follow-up  Status: Hold For - Appointment  Requested  for: [id number] Health Maintenance  2. UA With REFLEX; [Do Not Release]; Status:Complete;   [id number] 09:51AM  Discussion/Summary No sign of infection. We will proceed with having him undergo the procedure at the end of the month. I will see him in follow-up.   Signatures Electronically signed by : Jiles Crocker, Trey Paula; Aug 09 2015 10:15AM EST

## 2015-08-23 ENCOUNTER — Ambulatory Visit (HOSPITAL_BASED_OUTPATIENT_CLINIC_OR_DEPARTMENT_OTHER): Payer: Medicare Other | Admitting: Anesthesiology

## 2015-08-23 ENCOUNTER — Encounter (HOSPITAL_BASED_OUTPATIENT_CLINIC_OR_DEPARTMENT_OTHER): Payer: Self-pay | Admitting: *Deleted

## 2015-08-23 ENCOUNTER — Encounter (HOSPITAL_BASED_OUTPATIENT_CLINIC_OR_DEPARTMENT_OTHER)
Admission: RE | Disposition: A | Discharge status: Home / Self Care | Payer: Self-pay | Source: Ambulatory Visit | Attending: Urology

## 2015-08-23 ENCOUNTER — Ambulatory Visit (HOSPITAL_BASED_OUTPATIENT_CLINIC_OR_DEPARTMENT_OTHER)
Admission: RE | Admit: 2015-08-23 | Discharge: 2015-08-23 | Disposition: A | Discharge status: Home / Self Care | Payer: Medicare Other | Source: Ambulatory Visit | Attending: Urology | Admitting: Urology

## 2015-08-23 DIAGNOSIS — Z79899 Other long term (current) drug therapy: Secondary | ICD-10-CM | POA: Diagnosis not present

## 2015-08-23 DIAGNOSIS — N401 Enlarged prostate with lower urinary tract symptoms: Secondary | ICD-10-CM | POA: Insufficient documentation

## 2015-08-23 DIAGNOSIS — Z7982 Long term (current) use of aspirin: Secondary | ICD-10-CM | POA: Insufficient documentation

## 2015-08-23 DIAGNOSIS — I4891 Unspecified atrial fibrillation: Secondary | ICD-10-CM | POA: Diagnosis not present

## 2015-08-23 DIAGNOSIS — J45909 Unspecified asthma, uncomplicated: Secondary | ICD-10-CM | POA: Insufficient documentation

## 2015-08-23 DIAGNOSIS — G709 Myoneural disorder, unspecified: Secondary | ICD-10-CM | POA: Insufficient documentation

## 2015-08-23 DIAGNOSIS — N138 Other obstructive and reflux uropathy: Secondary | ICD-10-CM | POA: Diagnosis not present

## 2015-08-23 DIAGNOSIS — E7800 Pure hypercholesterolemia, unspecified: Secondary | ICD-10-CM | POA: Insufficient documentation

## 2015-08-23 DIAGNOSIS — R351 Nocturia: Secondary | ICD-10-CM | POA: Diagnosis not present

## 2015-08-23 DIAGNOSIS — I10 Essential (primary) hypertension: Secondary | ICD-10-CM | POA: Diagnosis not present

## 2015-08-23 DIAGNOSIS — Z87891 Personal history of nicotine dependence: Secondary | ICD-10-CM | POA: Diagnosis not present

## 2015-08-23 DIAGNOSIS — R3915 Urgency of urination: Secondary | ICD-10-CM | POA: Diagnosis not present

## 2015-08-23 DIAGNOSIS — I129 Hypertensive chronic kidney disease with stage 1 through stage 4 chronic kidney disease, or unspecified chronic kidney disease: Secondary | ICD-10-CM | POA: Diagnosis not present

## 2015-08-23 DIAGNOSIS — M1990 Unspecified osteoarthritis, unspecified site: Secondary | ICD-10-CM | POA: Insufficient documentation

## 2015-08-23 DIAGNOSIS — K759 Inflammatory liver disease, unspecified: Secondary | ICD-10-CM | POA: Diagnosis not present

## 2015-08-23 DIAGNOSIS — R350 Frequency of micturition: Secondary | ICD-10-CM | POA: Diagnosis not present

## 2015-08-23 DIAGNOSIS — Z9849 Cataract extraction status, unspecified eye: Secondary | ICD-10-CM | POA: Diagnosis not present

## 2015-08-23 DIAGNOSIS — N183 Chronic kidney disease, stage 3 (moderate): Secondary | ICD-10-CM | POA: Diagnosis not present

## 2015-08-23 DIAGNOSIS — N320 Bladder-neck obstruction: Secondary | ICD-10-CM | POA: Diagnosis not present

## 2015-08-23 HISTORY — DX: Other intervertebral disc degeneration, lumbar region without mention of lumbar back pain or lower extremity pain: M51.369

## 2015-08-23 HISTORY — DX: Unspecified symptoms and signs involving the genitourinary system: R39.9

## 2015-08-23 HISTORY — DX: Essential (primary) hypertension: I10

## 2015-08-23 HISTORY — DX: Personal history of colon polyps, unspecified: Z86.0100

## 2015-08-23 HISTORY — DX: Poor urinary stream: R39.12

## 2015-08-23 HISTORY — DX: Personal history of other diseases of the digestive system: Z87.19

## 2015-08-23 HISTORY — DX: Hyperlipidemia, unspecified: E78.5

## 2015-08-23 HISTORY — DX: Personal history of other medical treatment: Z92.89

## 2015-08-23 HISTORY — DX: Personal history of urinary calculi: Z87.442

## 2015-08-23 HISTORY — PX: GREEN LIGHT LASER TURP (TRANSURETHRAL RESECTION OF PROSTATE: SHX6260

## 2015-08-23 HISTORY — DX: Bladder-neck obstruction: N32.0

## 2015-08-23 HISTORY — DX: Personal history of other diseases of the circulatory system: Z86.79

## 2015-08-23 HISTORY — DX: Chronic kidney disease, stage 3 unspecified: N18.30

## 2015-08-23 HISTORY — DX: Chronic kidney disease, stage 3 (moderate): N18.3

## 2015-08-23 HISTORY — DX: Benign prostatic hyperplasia without lower urinary tract symptoms: N40.0

## 2015-08-23 HISTORY — DX: Other intervertebral disc degeneration, lumbar region: M51.36

## 2015-08-23 HISTORY — DX: Personal history of peptic ulcer disease: Z87.11

## 2015-08-23 HISTORY — DX: Personal history of other diseases of urinary system: Z87.448

## 2015-08-23 HISTORY — DX: Personal history of colonic polyps: Z86.010

## 2015-08-23 LAB — POCT I-STAT 4, (NA,K, GLUC, HGB,HCT)
Glucose, Bld: 93 mg/dL (ref 65–99)
HCT: 40 % (ref 39.0–52.0)
Hemoglobin: 13.6 g/dL (ref 13.0–17.0)
Potassium: 4.2 mmol/L (ref 3.5–5.1)
Sodium: 138 mmol/L (ref 135–145)

## 2015-08-23 SURGERY — GREEN LIGHT LASER TURP (TRANSURETHRAL RESECTION OF PROSTATE
Anesthesia: General

## 2015-08-23 MED ORDER — PROPOFOL 10 MG/ML IV BOLUS
INTRAVENOUS | Status: AC
  Filled 2015-08-23: qty 20

## 2015-08-23 MED ORDER — OXYCODONE HCL 5 MG/5ML PO SOLN
5.0000 mg | Freq: Once | ORAL | Status: DC | PRN
  Filled 2015-08-23: qty 5

## 2015-08-23 MED ORDER — STERILE WATER FOR IRRIGATION IR SOLN
Status: DC | PRN
  Administered 2015-08-23: 500 mL

## 2015-08-23 MED ORDER — HYDROMORPHONE HCL 1 MG/ML IJ SOLN
0.2500 mg | INTRAMUSCULAR | Status: DC | PRN
  Administered 2015-08-23 (×3): 0.25 mg via INTRAVENOUS
  Filled 2015-08-23: qty 1

## 2015-08-23 MED ORDER — OXYCODONE HCL 5 MG PO TABS
5.0000 mg | ORAL_TABLET | Freq: Once | ORAL | Status: DC | PRN
  Filled 2015-08-23: qty 1

## 2015-08-23 MED ORDER — TRAMADOL HCL 50 MG PO TABS
50.0000 mg | ORAL_TABLET | Freq: Once | ORAL | Status: AC
  Administered 2015-08-23: 50 mg via ORAL
  Filled 2015-08-23: qty 1

## 2015-08-23 MED ORDER — DEXAMETHASONE SODIUM PHOSPHATE 10 MG/ML IJ SOLN
INTRAMUSCULAR | Status: AC
  Filled 2015-08-23: qty 1

## 2015-08-23 MED ORDER — ONDANSETRON HCL 4 MG/2ML IJ SOLN
4.0000 mg | Freq: Four times a day (QID) | INTRAMUSCULAR | Status: DC | PRN
  Filled 2015-08-23: qty 2

## 2015-08-23 MED ORDER — PROPOFOL 10 MG/ML IV BOLUS
INTRAVENOUS | Status: DC | PRN
  Administered 2015-08-23: 160 mg via INTRAVENOUS

## 2015-08-23 MED ORDER — FENTANYL CITRATE (PF) 100 MCG/2ML IJ SOLN
INTRAMUSCULAR | Status: AC
  Filled 2015-08-23: qty 2

## 2015-08-23 MED ORDER — TRAMADOL HCL 50 MG PO TABS: 50.0000 mg | ORAL_TABLET | Freq: Four times a day (QID) | ORAL | Status: DC | PRN

## 2015-08-23 MED ORDER — ONDANSETRON HCL 4 MG/2ML IJ SOLN
INTRAMUSCULAR | Status: AC
  Filled 2015-08-23: qty 2

## 2015-08-23 MED ORDER — LIDOCAINE HCL (CARDIAC) 20 MG/ML IV SOLN
INTRAVENOUS | Status: AC
  Filled 2015-08-23: qty 5

## 2015-08-23 MED ORDER — ONDANSETRON HCL 4 MG/2ML IJ SOLN
INTRAMUSCULAR | Status: DC | PRN
  Administered 2015-08-23: 4 mg via INTRAVENOUS

## 2015-08-23 MED ORDER — SODIUM CHLORIDE 0.9 % IR SOLN
Status: DC | PRN
  Administered 2015-08-23: 3000 mL
  Administered 2015-08-23: 1000 mL
  Administered 2015-08-23 (×2): 6000 mL

## 2015-08-23 MED ORDER — CEFAZOLIN SODIUM 1-5 GM-% IV SOLN
1.0000 g | INTRAVENOUS | Status: DC
  Filled 2015-08-23: qty 50

## 2015-08-23 MED ORDER — FENTANYL CITRATE (PF) 100 MCG/2ML IJ SOLN
INTRAMUSCULAR | Status: DC | PRN
  Administered 2015-08-23 (×2): 25 ug via INTRAVENOUS
  Administered 2015-08-23: 50 ug via INTRAVENOUS

## 2015-08-23 MED ORDER — LIDOCAINE HCL (CARDIAC) 20 MG/ML IV SOLN
INTRAVENOUS | Status: DC | PRN
  Administered 2015-08-23: 60 mg via INTRAVENOUS

## 2015-08-23 MED ORDER — NITROFURANTOIN MONOHYD MACRO 100 MG PO CAPS: 100.0000 mg | ORAL_CAPSULE | Freq: Every day | ORAL | Status: DC

## 2015-08-23 MED ORDER — EPHEDRINE SULFATE 50 MG/ML IJ SOLN
INTRAMUSCULAR | Status: AC
  Filled 2015-08-23: qty 1

## 2015-08-23 MED ORDER — CEFAZOLIN SODIUM-DEXTROSE 2-3 GM-% IV SOLR
INTRAVENOUS | Status: AC
  Filled 2015-08-23: qty 50

## 2015-08-23 MED ORDER — TRAMADOL HCL 50 MG PO TABS
ORAL_TABLET | ORAL | Status: AC
  Filled 2015-08-23: qty 1

## 2015-08-23 MED ORDER — DEXAMETHASONE SODIUM PHOSPHATE 4 MG/ML IJ SOLN
INTRAMUSCULAR | Status: DC | PRN
  Administered 2015-08-23: 10 mg via INTRAVENOUS

## 2015-08-23 MED ORDER — CEFAZOLIN SODIUM-DEXTROSE 2-3 GM-% IV SOLR
2.0000 g | INTRAVENOUS | Status: AC
  Administered 2015-08-23: 2 g via INTRAVENOUS
  Filled 2015-08-23: qty 50

## 2015-08-23 MED ORDER — EPHEDRINE SULFATE 50 MG/ML IJ SOLN
INTRAMUSCULAR | Status: DC | PRN
  Administered 2015-08-23: 10 mg via INTRAVENOUS

## 2015-08-23 MED ORDER — HYDROMORPHONE HCL 1 MG/ML IJ SOLN
INTRAMUSCULAR | Status: AC
  Filled 2015-08-23: qty 1

## 2015-08-23 MED ORDER — LACTATED RINGERS IV SOLN
INTRAVENOUS | Status: DC
  Administered 2015-08-23: 06:00:00 via INTRAVENOUS
  Filled 2015-08-23: qty 1000

## 2015-08-23 SURGICAL SUPPLY — 29 items
BAG URINE DRAINAGE (UROLOGICAL SUPPLIES) ×3 IMPLANT
BAG URO CATCHER STRL LF (DRAPE) ×3 IMPLANT
CATH COUDE FOLEY 2W 5CC 18FR (CATHETERS) ×3 IMPLANT
CATH FOLEY 2WAY SLVR  5CC 18FR (CATHETERS) ×2
CATH FOLEY 2WAY SLVR 5CC 18FR (CATHETERS) IMPLANT
CLOTH BEACON ORANGE TIMEOUT ST (SAFETY) ×3 IMPLANT
ELECT BIVAP BIPO 22/24 DONUT (ELECTROSURGICAL)
ELECT LOOP MED HF 24F 12D (CUTTING LOOP) IMPLANT
ELECTRD BIVAP BIPO 22/24 DONUT (ELECTROSURGICAL) IMPLANT
FEE RENTAL LASER GREENLIGHT (Laser) ×1 IMPLANT
GLOVE BIO SURGEON STRL SZ7.5 (GLOVE) ×3 IMPLANT
GOWN STRL REUS W/ TWL XL LVL3 (GOWN DISPOSABLE) ×1 IMPLANT
GOWN STRL REUS W/TWL XL LVL3 (GOWN DISPOSABLE) ×3
HOLDER FOLEY CATH W/STRAP (MISCELLANEOUS) IMPLANT
IV NS 1000ML (IV SOLUTION) ×3
IV NS 1000ML BAXH (IV SOLUTION) ×1 IMPLANT
IV NS IRRIG 3000ML ARTHROMATIC (IV SOLUTION) ×13 IMPLANT
IV SET EXTENSION GRAVITY 40 LF (IV SETS) ×3 IMPLANT
KIT ROOM TURNOVER WOR (KITS) ×3 IMPLANT
LASER FIBER /GREENLIGHT LASER (Laser) ×3 IMPLANT
LASER GREENLIGHT RENTAL P/PROC (Laser) ×3 IMPLANT
LOOP CUT BIPOLAR 24F LRG (ELECTROSURGICAL) IMPLANT
MANIFOLD NEPTUNE II (INSTRUMENTS) ×2 IMPLANT
PACK CYSTO (CUSTOM PROCEDURE TRAY) ×3 IMPLANT
SYR 30ML LL (SYRINGE) IMPLANT
SYRINGE IRR TOOMEY STRL 70CC (SYRINGE) IMPLANT
TUBE CONNECTING 12'X1/4 (SUCTIONS) ×1
TUBE CONNECTING 12X1/4 (SUCTIONS) ×1 IMPLANT
WATER STERILE IRR 500ML POUR (IV SOLUTION) ×3 IMPLANT

## 2015-08-23 NOTE — Interval H&P Note (Signed)
History and Physical Interval Note:  08/23/2015 7:25 AM  [name]  has presented today for surgery, with the diagnosis of BPH with Bladder Outlet Obstruction  The various methods of treatment have been discussed with the patient and family. After consideration of risks, benefits and other options for treatment, the patient has consented to  Procedure(s): GREEN LIGHT LASER TURP (TRANSURETHRAL RESECTION OF PROSTATE (N/A) as a surgical intervention .  The patient's history has been reviewed, patient examined, no change in status, stable for surgery. I discussed with the patient the nature, potential benefits, risks and alternatives to greenlight photo vaporization of the prostate possible TURP, including side effects of the proposed treatment, the likelihood of the patient achieving the goals of the procedure, and any potential problems that might occur during the procedure or recuperation. We discussed typical improvement in flow symptoms with the hope of improvement in irritative symptoms such as frequency and urgency, but occasional persistence of irritative symptoms and rarely increase in irritative symptoms. We also discussed risk of stricture formation especially in light of the patient's prior procedures among other risks. I have reviewed the patient's chart and labs.  Questions were answered to the patient's satisfaction.  He elects to proceed.    [name]

## 2015-08-23 NOTE — Anesthesia Preprocedure Evaluation (Signed)
Anesthesia Evaluation  Patient identified by MRN, date of birth, ID band Patient awake    Reviewed: Allergy & Precautions, NPO status , Patient's Chart, lab work & pertinent test results  Airway Mallampati: II   Neck ROM: full    Dental   Pulmonary former smoker,    breath sounds clear to auscultation       Cardiovascular hypertension, + dysrhythmias Atrial Fibrillation  Rhythm:regular Rate:Normal     Neuro/Psych  Neuromuscular disease    GI/Hepatic   Endo/Other    Renal/GU Renal disease     Musculoskeletal  (+) Arthritis ,   Abdominal   Peds  Hematology   Anesthesia Other Findings   Reproductive/Obstetrics                             Anesthesia Physical Anesthesia Plan  ASA: III  Anesthesia Plan: General   Post-op Pain Management:    Induction: Intravenous  Airway Management Planned: LMA  Additional Equipment:   Intra-op Plan:   Post-operative Plan:   Informed Consent: I have reviewed the patients History and Physical, chart, labs and discussed the procedure including the risks, benefits and alternatives for the proposed anesthesia with the patient or authorized representative who has indicated his/her understanding and acceptance.     Plan Discussed with: CRNA, Anesthesiologist and Surgeon  Anesthesia Plan Comments:         Anesthesia Quick Evaluation

## 2015-08-23 NOTE — Op Note (Signed)
Preoperative diagnosis: BPH, bladder outlet obstruction, weak stream Postoperative diagnosis: Same  Procedure: Exam under anesthesia, greenlight photo vaporization of the prostate  Surgeon: Junious Silk  Anesthesia: Gen.  Indication for procedure: 80 year old with a history of prior TURP with obstructive and irritative voiding symptoms. On urodynamics and cystoscopy appeared to have obstructing lateral lobes of the prostate which were primarily anterior coming together at the mid prostatic urethra down to the apex. He was brought today for greenlight photo vaporization of these lateral lobes.  Findings: On exam under anesthesia the penis circumcised without mass or lesion. Testicles were descended bilaterally and palpably normal. On digital rectal exam the prostate was smooth without hard area or nodule. All landmarks were preserved.  On cystoscopy the urethra appeared normal, the prostatic urethra was visually obstructed by lateral lobe prostatic tissue that was more anterior the bladder neck but by the mid prostatic urethra had come together and was obstructing from there down to the apex. The bladder neck itself was widely patent but the prostatic urethra was obstructed by lateral lobe tissue. The lateral lobe tissue was vaporized and the bladder neck was not vaporized. The trigone and ureteral orifices were in their normal orthotopic position. There is clear efflux. Bladder mucosa was unremarkable. There were no stones or foreign bodies in the bladder.  Description of procedure: After consent was obtained patient brought to the operating room. After adequate anesthesia he was placed in lithotomy position and prepped and draped in the usual sterile fashion. A timeout was performed to confirm the patient and procedure. An exam under anesthesia was performed. The laser sheath and scope were passed per urethra and the bladder inspected. The laser fiber was then deployed. At a setting of 80 I started the  vaporization at the prosthetic tissue on the bladder neck side on the right and brought this down in the usual hockey-stick fashion in the apex. I then turned the energy up to 120 and went back and forth anterior to posterior from the bladder neck side down to the apical vaporizing the lateral lobe tissue. As I vaporized the lateral lobe drop-in anteriorly and this was vaporized as it came into view.   I then turned the setting back down to 80 and did a similar procedure on the left side starting on the bladder neck side vaporizing and bringing this down into the typical hockey-stick fashion at the veru. I then worked back and forth from apical to bladder neck side anterior to posterior vaporizing the lateral lobe tissue and any anterior tissue that dropped down into view. It was easily seen were the lateral lobes were filling the prostatic urethra which again was patent back toward the bladder neck. The lateral lobes were vaporized at any time more inferiorly and up toward the bladder neck I could see prostatic urethra that was opened and resected this area was not vaporized. After vaporizing both sides and with the bladder drained there was adequate hemostasis and clearly a good channel. Tissue was not dropping down anteriorly either. I felt this was a good in result for the time being. The bladder was filled and the scope removed. A place an 18 coud catheter with 14 mL in the balloon and seated at the bladder neck. Urine was clear. Patient was awakened and taken to the recovery room in stable condition.   Complications: None  Blood loss: Minimal  Specimens: None   Drains: 18 French Foley

## 2015-08-23 NOTE — Anesthesia Procedure Notes (Signed)
Procedure Name: LMA Insertion Date/Time: 08/23/2015 7:34 AM Performed by: Denna Haggard D Pre-anesthesia Checklist: Patient identified, Emergency Drugs available, Suction available and Patient being monitored Patient Re-evaluated:Patient Re-evaluated prior to inductionOxygen Delivery Method: Circle System Utilized Preoxygenation: Pre-oxygenation with 100% oxygen Intubation Type: IV induction Ventilation: Mask ventilation without difficulty LMA: LMA inserted LMA Size: 4.0 Number of attempts: 1 Airway Equipment and Method: Bite block Placement Confirmation: positive ETCO2 Tube secured with: Tape Dental Injury: Teeth and Oropharynx as per pre-operative assessment

## 2015-08-23 NOTE — Discharge Instructions (Signed)
Foley Catheter Care, Adult A Foley catheter is a soft, flexible tube. This tube is placed into your bladder to drain pee (urine). If you go home with this catheter in place, follow the instructions below. TAKING CARE OF THE CATHETER  Wash your hands with soap and water.  Put soap and water on a clean washcloth.  Clean the skin where the tube goes into your body.  Clean away from the tube site.  Never wipe toward the tube.  Clean the area using a circular motion.  Remove all the soap. Pat the area dry with a clean towel. For males, reposition the skin that covers the end of the penis (foreskin).  Attach the tube to your leg with tape or a leg strap. Do not stretch the tube tight. If you are using tape, remove any stickiness left behind by past tape you used.  Keep the drainage bag below your hips. Keep it off the floor.  Check your tube during the day. Make sure it is working and draining. Make sure the tube does not curl, twist, or bend.  Do not pull on the tube or try to take it out. TAKING CARE OF THE DRAINAGE BAGS You will have a large overnight drainage bag and a small leg bag. You may wear the overnight bag any time. Never wear the small bag at night. Follow the directions below. Emptying the Drainage Bag Empty your drainage bag when it is  - full or at least 2-3 times a day.  Wash your hands with soap and water.  Keep the drainage bag below your hips.  Hold the dirty bag over the toilet or clean container.  Open the pour spout at the bottom of the bag. Empty the pee into the toilet or container. Do not let the pour spout touch anything.  Clean the pour spout with a gauze pad or cotton ball that has rubbing alcohol on it.  Close the pour spout.  Attach the bag to your leg with tape or a leg strap.  Wash your hands well. Changing the Drainage Bag Change your bag once a month or sooner if it starts to smell or look dirty.   Wash your hands with soap and  water.  Pinch the rubber tube so that pee does not spill out.  Disconnect the catheter tube from the drainage tube at the connection valve. Do not let the tubes touch anything.  Clean the end of the catheter tube with an alcohol wipe. Clean the end of a the drainage tube with a different alcohol wipe.  Connect the catheter tube to the drainage tube of the clean drainage bag.  Attach the new bag to the leg with tape or a leg strap. Avoid attaching the new bag too tightly.  Wash your hands well. Cleaning the Drainage Bag  Wash your hands with soap and water.  Wash the bag in warm, soapy water.  Rinse the bag with warm water.  Fill the bag with a mixture of white vinegar and water (1 cup vinegar to 1 quart warm water [.2 liter vinegar to 1 liter warm water]). Close the bag and soak it for 30 minutes in the solution.  Rinse the bag with warm water.  Hang the bag to dry with the pour spout open and hanging downward.  Store the clean bag (once it is dry) in a clean plastic bag.  Wash your hands well. PREVENT INFECTION  Wash your hands before and after touching your tube.  Take showers every day. Wash the skin where the tube enters your body. Do not take baths. Replace wet leg straps with dry ones, if this applies.  Do not use powders, sprays, or lotions on the genital area. Only use creams, lotions, or ointments as told by your doctor.  For females, wipe from front to back after going to the bathroom.  Drink enough fluids to keep your pee clear or pale yellow unless you are told not to have too much fluid (fluid restriction).  Do not let the drainage bag or tubing touch or lie on the floor.  Wear cotton underwear to keep the area dry. GET HELP IF:  Your pee is cloudy or smells unusually bad.  Your tube becomes clogged.  You are not draining pee into the bag or your bladder feels full.  Your tube starts to leak. GET HELP RIGHT AWAY IF:  You have pain, puffiness  (swelling), redness, or yellowish-white fluid (pus) where the tube enters the body.  You have pain in the belly (abdomen), legs, lower back, or bladder.  You have a fever.  You see blood fill the tube, or your pee is pink or red.  You feel sick to your stomach (nauseous), throw up (vomit), or have chills.  Your tube gets pulled out. MAKE SURE YOU:   Understand these instructions.  Will watch your condition.  Will get help right away if you are not doing well or get worse.   This information is not intended to replace advice given to you by your health care provider. Make sure you discuss any questions you have with your health care provider.   Document Released: 01/05/2013 Document Revised: 10/01/2014 Document Reviewed: 01/05/2013 Elsevier Interactive Patient Education 2016 [employer]   Transurethral Resection of the Prostate, Care After Refer to this sheet in the next few weeks. These instructions provide you with information on caring for yourself after your procedure. Your caregiver also may give you specific instructions. Your treatment has been planned according to current medical practices, but complications sometimes occur. Call your caregiver if you have any problems or questions after your procedure. HOME CARE INSTRUCTIONS  Recovery can take 4-6 weeks. Avoid alcohol, caffeinated drinks, and spicy foods for 2 weeks after your procedure. Drink enough fluids to keep your urine clear or pale yellow. Urinate as soon as you feel the urge to do so. Do not try to hold your urine for long periods of time. During recovery you may experience pain caused by bladder spasms, which result in a very intense urge to urinate. Take all medicines as directed by your caregiver, including medicines for pain. Try to limit the amount of pain medicines you take because it can cause constipation. If you do become constipated, do not strain to move your bowels. Straining can increase bleeding.  Constipation can be minimized by increasing the amount fluids and fiber in your diet. Your caregiver also may prescribe a stool softener. Do not lift heavy objects (more than 5 lb [2.25 kg]) or perform exercises that cause you to strain for at least 1 month after your procedure. When sitting, you may want to sit in a soft chair or use a cushion. For the first 10 days after your procedure, avoid the following activities:  Running.  Strenuous work.  Long walks.  Riding in a car for extended periods.  Sex. SEEK MEDICAL CARE IF:  You have difficulty urinating.  You have blood in your urine that does not go away  after you rest or increase your fluid intake.  You have swelling in your penis or scrotum. SEEK IMMEDIATE MEDICAL CARE IF:   You are suddenly unable to urinate.  You notice blood clots in your urine.  You have chills.  You have a fever.  You have pain in your back or lower abdomen.  You have pain or swelling in your legs. MAKE SURE YOU:   Understand these instructions.  Will watch your condition.  Will get help right away if you are not doing well or get worse.   This information is not intended to replace advice given to you by your health care provider. Make sure you discuss any questions you have with your health care provider.   Document Released: 09/10/2005 Document Revised: 10/01/2014 Document Reviewed: 10/19/2011 Elsevier Interactive Patient Education 2016 [place] Anest[facility] Home Care Instructions  Activity: Get plenty of rest for the remainder of the day. A responsible adult should stay with you for 24 hours following the procedure.  For the next 24 hours, DO NOT: -Drive a car -Paediatric nurse -Drink alcoholic beverages -Take any medication unless instructed by your physician -Make any legal decisions or sign important papers.  Meals: Start with liquid foods such as gelatin or soup. Progress to regular foods as tolerated. Avoid  greasy, spicy, heavy foods. If nausea and/or vomiting occur, drink only clear liquids until the nausea and/or vomiting subsides. Call your physician if vomiting continues.  Special Instructions/Symptoms: Your throat may feel dry or sore from the anesthesia or the breathing tube placed in your throat during surgery. If this causes discomfort, gargle with warm salt water. The discomfort should disappear within 24 hours.  If you had a scopolamine patch placed behind your ear for the management of post- operative nausea and/or vomiting:  1. The medication in the patch is effective for 72 hours, after which it should be removed.  Wrap patch in a tissue and discard in the trash. Wash hands thoroughly with soap and water. 2. You may remove the patch earlier than 72 hours if you experience unpleasant side effects which may include dry mouth, dizziness or visual disturbances. 3. Avoid touching the patch. Wash your hands with soap and water after contact with the patch.

## 2015-08-23 NOTE — Anesthesia Postprocedure Evaluation (Signed)
Anesthesia Post Note  Patient: [name]  Procedure(s) Performed: Procedure(s) (LRB): GREEN LIGHT LASER TURP (TRANSURETHRAL RESECTION OF PROSTATE (N/A)  Patient location during evaluation: PACU Anesthesia Type: General Level of consciousness: awake and alert and patient cooperative Pain management: pain level controlled Vital Signs Assessment: post-procedure vital signs reviewed and stable Respiratory status: spontaneous breathing and respiratory function stable Cardiovascular status: stable Anesthetic complications: no    Last Vitals:  Filed Vitals:   08/23/15 0915 08/23/15 0925  BP: 147/77   Pulse: 70 73  Temp:    Resp: 17 13    Last Pain:  Filed Vitals:   08/23/15 0927  PainSc: [place]

## 2015-08-23 NOTE — Transfer of Care (Signed)
Immediate Anesthesia Transfer of Care Note  Patient: [name]  Procedure(s) Performed: Procedure(s) (LRB): GREEN LIGHT LASER TURP (TRANSURETHRAL RESECTION OF PROSTATE (N/A)  Patient Location: PACU  Anesthesia Type: General  Level of Consciousness: awake, oriented, sedated and patient cooperative  Airway & Oxygen Therapy: Patient Spontanous Breathing and Patient connected to face mask oxygen  Post-op Assessment: Report given to PACU RN and Post -op Vital signs reviewed and stable  Post vital signs: Reviewed and stable  Complications: No apparent anesthesia complications

## 2015-08-24 ENCOUNTER — Encounter (HOSPITAL_BASED_OUTPATIENT_CLINIC_OR_DEPARTMENT_OTHER): Payer: Self-pay | Admitting: Urology

## 2015-09-01 DIAGNOSIS — N401 Enlarged prostate with lower urinary tract symptoms: Secondary | ICD-10-CM | POA: Diagnosis not present

## 2015-09-01 DIAGNOSIS — N390 Urinary tract infection, site not specified: Secondary | ICD-10-CM | POA: Diagnosis not present

## 2015-09-01 DIAGNOSIS — R3912 Poor urinary stream: Secondary | ICD-10-CM | POA: Diagnosis not present

## 2015-09-09 DIAGNOSIS — M5136 Other intervertebral disc degeneration, lumbar region: Secondary | ICD-10-CM | POA: Diagnosis not present

## 2015-09-09 DIAGNOSIS — M5416 Radiculopathy, lumbar region: Secondary | ICD-10-CM | POA: Diagnosis not present

## 2015-10-05 ENCOUNTER — Other Ambulatory Visit: Payer: Self-pay | Admitting: Family Medicine

## 2015-10-05 DIAGNOSIS — M5136 Other intervertebral disc degeneration, lumbar region: Secondary | ICD-10-CM

## 2015-10-05 DIAGNOSIS — G4701 Insomnia due to medical condition: Secondary | ICD-10-CM

## 2015-10-05 DIAGNOSIS — I10 Essential (primary) hypertension: Secondary | ICD-10-CM

## 2015-10-05 NOTE — Telephone Encounter (Signed)
Pt needs refill on Alprazolam, Lisinopril Tramodol. Pt states Alprazolam is the only thing he needs right now. He states the others he will be out of before he comes back to see Dr Nadine Counts.

## 2015-10-07 ENCOUNTER — Other Ambulatory Visit: Payer: Self-pay

## 2015-10-07 DIAGNOSIS — G4701 Insomnia due to medical condition: Secondary | ICD-10-CM

## 2015-10-07 DIAGNOSIS — M5136 Other intervertebral disc degeneration, lumbar region: Secondary | ICD-10-CM

## 2015-10-10 MED ORDER — LISINOPRIL 20 MG PO TABS
20.0000 mg | ORAL_TABLET | Freq: Every morning | ORAL | Status: AC
Start: 2015-10-10 — End: ?

## 2015-10-10 NOTE — Telephone Encounter (Signed)
Patient would like to know if it is possible to get another provider to refill the alprazolam. States that it will take several days before he will get it through mail order therefore he is asking for 30day supply to be called into his local pharmacy [facility]. Please return call to let patient know if this will be able to be done or not. [phone]

## 2015-10-17 ENCOUNTER — Encounter: Payer: Self-pay | Admitting: Family Medicine

## 2015-10-17 ENCOUNTER — Ambulatory Visit (INDEPENDENT_AMBULATORY_CARE_PROVIDER_SITE_OTHER): Payer: Medicare Other | Admitting: Family Medicine

## 2015-10-17 VITALS — BP 120/68 | HR 64 | Temp 98.3°F | Resp 16 | Wt 187.0 lb

## 2015-10-17 DIAGNOSIS — G4700 Insomnia, unspecified: Secondary | ICD-10-CM

## 2015-10-17 DIAGNOSIS — M5136 Other intervertebral disc degeneration, lumbar region: Secondary | ICD-10-CM

## 2015-10-17 DIAGNOSIS — Z9079 Acquired absence of other genital organ(s): Secondary | ICD-10-CM

## 2015-10-17 DIAGNOSIS — N401 Enlarged prostate with lower urinary tract symptoms: Secondary | ICD-10-CM | POA: Diagnosis not present

## 2015-10-17 DIAGNOSIS — I10 Essential (primary) hypertension: Secondary | ICD-10-CM | POA: Diagnosis not present

## 2015-10-17 DIAGNOSIS — Z9889 Other specified postprocedural states: Secondary | ICD-10-CM

## 2015-10-17 MED ORDER — ALPRAZOLAM 0.5 MG PO TABS: 0.5000 mg | ORAL_TABLET | Freq: Every evening | ORAL | Status: AC | PRN

## 2015-10-17 NOTE — Progress Notes (Signed)
Name: MARZ JANICE   MRN: [id number]    DOB: January 20, 1935   Date:10/17/2015       Progress Note  Subjective  Chief Complaint  Chief Complaint  Patient presents with  . Medication Refill  . Hypertension  . Insomnia  . Back Pain    HPI  Mr. Jarelle Hagans is a 81 year old male here for routine f/u of chronic conditions and refills. He reports that he has undergone TURP with his urology specialist 07/2015 and is doing well post surgery. No longer needs flomax.   Hypertension: Patient is here for evaluation of elevated blood pressures. Age at onset of elevated blood pressure: many decades ago. Cardiac symptoms fatigue. Patient denies chest pain, chest pressure/discomfort, dyspnea, irregular heart beat, lower extremity edema, near-syncope, palpitations and tachypnea. Cardiovascular risk factors: advanced age (older than 54 for men, 36 for women), hypertension and male gender. Use of agents associated with hypertension: NSAIDS. History of target organ damage: none. Current medication is Lisinopril 20 mg a day. No refill needed today.  Joint/Muscle Pain: Patient complains of arthralgias for which has been present for several years. Pain is located in back, is described as aching, and is intermittent, moderate . Associated symptoms include: none. The patient has been using ibuprofen 600mg  po up to tid prn. Also prescribed tramadol 50mg  2 po q8hrs. Related to injury: no. No refill needed today.  Insomnia: Otherwise he is requesting refill of her insomnia mediation Xanax 0.5 mg. Patient reports having a hard time falling asleep and staying asleep more than 4 hrs in duration at once. This is a long standing problem for many years. Has tried OTC melatonin, unisom, benadryl with minimal relief of insomnia. Refill needed today.   Past Medical History  Diagnosis Date  . History of colon polyps   . History of atrial fibrillation     episode PAF without current medication--  per pt happened 2002  approx.  no issues or symptoms since  . Hyperlipidemia   . History of gastric ulcer   . Lower urinary tract symptoms (LUTS)   . Weak urinary stream   . History of urinary tract obstruction     bnc  . BPH (benign prostatic hyperplasia)   . Bladder outlet obstruction   . CKD (chronic kidney disease), stage III   . History of kidney stones   . DDD (degenerative disc disease), lumbar   . Hypertension   . History of cardiovascular stress test     per pt done 2002 in setting of once episode PAF and was normal    Patient Active Problem List   Diagnosis Date Noted  . Acute pharyngitis 04/05/2015  . Benign prostatic hypertrophy with lower urinary tract symptoms (LUTS) 04/05/2015  . Chronic kidney disease (CKD), stage III (moderate) 04/05/2015  . H/O disease 04/05/2015  . Hypertension goal BP (blood pressure) < 150/90 04/05/2015  . Insomnia due to medical condition 04/05/2015  . Arthritis, degenerative 04/05/2015  . Bacterial upper respiratory infection 04/05/2015  . Triggering of digit 04/05/2015  . Annual physical exam 04/05/2015  . DDD (degenerative disc disease), lumbar 10/27/2014  . Hyperlipidemia LDL goal <100 07/07/2014  . Neuritis or radiculitis due to rupture of lumbar intervertebral disc 07/07/2014  . Auditory vertigo 07/07/2014  . Enthesopathy of hip 01/05/2014  . Benign essential HTN 01/05/2014    Social History  Substance Use Topics  . Smoking status: Former Smoker -- 1.00 packs/day for 10 years    Types: Cigarettes  Quit date: 08/15/1965  . Smokeless tobacco: Never Used  . Alcohol Use: No     Current outpatient prescriptions:  .  ALPRAZolam (XANAX) 0.5 MG tablet, Take 1 tablet (0.5 mg total) by mouth at bedtime as needed for anxiety., Disp: 90 tablet, Rfl: 2 .  aspirin EC 81 MG tablet, Take 81 mg by mouth daily., Disp: , Rfl:  .  lisinopril (PRINIVIL,ZESTRIL) 20 MG tablet, Take 1 tablet (20 mg total) by mouth every morning., Disp: 90 tablet, Rfl: 1 .  Misc  Natural Products (GLUCOSAMINE CHONDROITIN MSM PO), Take 2 tablets by mouth every morning., Disp: , Rfl:  .  Omega-3 Fatty Acids (FISH OIL) 1000 MG CAPS, Take 1 capsule by mouth every morning., Disp: , Rfl:  .  Polyethylene Glycol 3350 (MIRALAX PO), Take by mouth as needed., Disp: , Rfl:  .  Saw Palmetto, Serenoa repens, (SAW PALMETTO PO), Take 1 capsule by mouth every morning., Disp: , Rfl:  .  traMADol (ULTRAM) 50 MG tablet, Take 2 tablets (100 mg total) by mouth every 8 (eight) hours as needed for severe pain., Disp: 540 tablet, Rfl: 2 .  traMADol (ULTRAM) 50 MG tablet, Take 1 tablet (50 mg total) by mouth every 6 (six) hours as needed., Disp: 30 tablet, Rfl: 0  Past Surgical History  Procedure Laterality Date  . Appendectomy  1957  . Hemorrhoid surgery  2006  . Nasal septum surgery  1965  . Prostate surgery  1999   TUNA  . Transurethral resection of prostate  05-08-2006    and  EHL  prostatic calculi  . Colonoscopy  last one 01-28-2013  . Cataract extraction w/ intraocular lens  implant, bilateral    . Adenoidectomy  1960's  . Cataract extraction w/ intraocular lens  implant, bilateral  06/ 2013  . Green light laser turp (transurethral resection of prostate N/A 08/23/2015    Procedure: GREEN LIGHT LASER TURP (TRANSURETHRAL RESECTION OF PROSTATE;  Surgeon: Festus Aloe, MD;  Location: [facility];  Service: Urology;  Laterality: N/A;    Family History  Problem Relation Age of Onset  . Cancer Sister     breast  . Cancer Brother     prostate    No Known Allergies   Review of Systems  CONSTITUTIONAL: No significant weight changes, fever, chills, weakness or fatigue.  CARDIOVASCULAR: No chest pain, chest pressure or chest discomfort. No palpitations or edema.  RESPIRATORY: No shortness of breath, cough or sputum.  GASTROINTESTINAL: No anorexia, nausea, vomiting. No changes in bowel habits. No abdominal pain or blood.  GENITOURINARY: No dysuria. No frequency.  No discharge.  NEUROLOGICAL: No headache, dizziness, syncope, paralysis, ataxia, numbness or tingling in the extremities. No memory changes. No change in bowel or bladder control.  MUSCULOSKELETAL: Chronic joint pain. No muscle pain. HEMATOLOGIC: No anemia, bleeding or bruising.  LYMPHATICS: No enlarged lymph nodes.  PSYCHIATRIC: No change in mood. No change in sleep pattern.  ENDOCRINOLOGIC: No reports of sweating, cold or heat intolerance. No polyuria or polydipsia.     Objective  BP 120/68 mmHg  Pulse 64  Temp(Src) 98.3 F (36.8 C) (Oral)  Resp 16  Wt 187 lb (84.823 kg)  SpO2 97% Body mass index is 26.09 kg/(m^2).  Physical Exam  Constitutional: Patient appears well-developed and well-nourished. In no distress.  Cardiovascular: Normal rate, regular rhythm and normal heart sounds.  No murmur heard.  Pulmonary/Chest: Effort normal and breath sounds normal. No respiratory distress. Musculoskeletal: Normal range of motion bilateral UE  and LE, no joint effusions. Lumbar spine with no palpable step off, good flexion and extension at trunk w/o paraspinal muscle tension.  Peripheral vascular: Bilateral LE no edema. Neurological: CN II-XII grossly intact with no focal deficits. Alert and oriented to person, place, and time. Coordination, balance, strength, speech and gait are normal.  Skin: Skin is warm and dry. No rash noted. No erythema.  Psychiatric: Patient has a normal mood and affect. Behavior is normal in office today. Judgment and thought content normal in office today.   Recent Results (from the past 2160 hour(s))  I-STAT 4, (NA,K, GLUC, HGB,HCT)     Status: None   Collection Time: 08/23/15  6:22 AM  Result Value Ref Range   Sodium 138 135 - 145 mmol/L   Potassium 4.2 3.5 - 5.1 mmol/L   Glucose, Bld 93 65 - 99 mg/dL   HCT 40.0 39.0 - 52.0 %   Hemoglobin 13.6 13.0 - 17.0 g/dL     Assessment & Plan  1. Hypertension goal BP (blood pressure) < 140/90 Well controled.  Call for refills.   2. DDD (degenerative disc disease), lumbar Stable, did not need refill of tramadol today. We discussed potential pathology and long term risk of reoccurrence. Maintaining an ideal body habitus, regular exercise, proper lifting techniques and mindfulness of exacerbating factors will be useful in long term management.  Instructed on use of heating pad with exercises. Consider concomitant therapy with PT, massage therapist or chiropractor. May use anti-inflammatory medication and muscle relaxer as needed.  3. Insomnia, controlled Well controled, refilled.  - ALPRAZolam (XANAX) 0.5 MG tablet; Take 1 tablet (0.5 mg total) by mouth at bedtime as needed for anxiety.  Dispense: 90 tablet; Refill: 2  4. Benign prostatic hypertrophy with lower urinary tract symptoms (LUTS) Stable. Improved symptomatically since TURP.   5. Hx of transurethral resection of prostate

## 2015-11-22 DIAGNOSIS — M5416 Radiculopathy, lumbar region: Secondary | ICD-10-CM | POA: Diagnosis not present

## 2015-11-22 DIAGNOSIS — M5136 Other intervertebral disc degeneration, lumbar region: Secondary | ICD-10-CM | POA: Diagnosis not present

## 2015-11-23 DIAGNOSIS — Z0000 Encounter for general adult medical examination without abnormal findings: Secondary | ICD-10-CM | POA: Diagnosis not present

## 2015-11-23 DIAGNOSIS — L570 Actinic keratosis: Secondary | ICD-10-CM | POA: Diagnosis not present

## 2015-11-23 DIAGNOSIS — I10 Essential (primary) hypertension: Secondary | ICD-10-CM | POA: Diagnosis not present

## 2015-11-23 DIAGNOSIS — Z125 Encounter for screening for malignant neoplasm of prostate: Secondary | ICD-10-CM | POA: Diagnosis not present

## 2015-11-30 DIAGNOSIS — Z0000 Encounter for general adult medical examination without abnormal findings: Secondary | ICD-10-CM | POA: Diagnosis not present

## 2015-11-30 DIAGNOSIS — N400 Enlarged prostate without lower urinary tract symptoms: Secondary | ICD-10-CM | POA: Diagnosis not present

## 2015-11-30 DIAGNOSIS — N281 Cyst of kidney, acquired: Secondary | ICD-10-CM | POA: Diagnosis not present

## 2016-04-06 ENCOUNTER — Ambulatory Visit: Payer: Medicare Other | Admitting: Family Medicine

## 2016-04-10 ENCOUNTER — Ambulatory Visit: Payer: Medicare Other | Admitting: Family Medicine

## 2017-09-25 ENCOUNTER — Ambulatory Visit: Payer: Medicare HMO | Attending: Orthopedic Surgery

## 2017-09-25 DIAGNOSIS — G8929 Other chronic pain: Secondary | ICD-10-CM | POA: Diagnosis not present

## 2017-09-25 DIAGNOSIS — M25612 Stiffness of left shoulder, not elsewhere classified: Secondary | ICD-10-CM | POA: Diagnosis not present

## 2017-09-25 DIAGNOSIS — M25512 Pain in left shoulder: Secondary | ICD-10-CM | POA: Insufficient documentation

## 2017-09-25 DIAGNOSIS — M6281 Muscle weakness (generalized): Secondary | ICD-10-CM | POA: Diagnosis not present

## 2017-09-25 NOTE — Therapy (Signed)
[facility] [address] [place], [address] Phone: [phone]   Fax:  [phone]  Physical Therapy Evaluation  Patient Details  Name: ELYAS VILLAMOR MRN: [id number] Date of Birth: 1935-06-11 Referring Provider: Wyatt Portela PA   Encounter Date: 09/25/2017  PT End of Session - 09/25/17 1223    Visit Number  1    Number of Visits  7    Authorization Type  1 / 10 G Codes    PT Start Time  1000    PT Stop Time  1100    PT Time Calculation (min)  60 min    Activity Tolerance  Patient tolerated treatment well    Behavior During Therapy  [facility] for tasks assessed/performed       Past Medical History:  Diagnosis Date  . Bladder outlet obstruction   . BPH (benign prostatic hyperplasia)   . CKD (chronic kidney disease), stage III ([place])   . DDD (degenerative disc disease), lumbar   . History of atrial fibrillation    episode PAF without current medication--  per pt happened 2002 approx.  no issues or symptoms since  . History of cardiovascular stress test    per pt done 2002 in setting of once episode PAF and was normal  . History of colon polyps   . History of gastric ulcer   . History of kidney stones   . History of urinary tract obstruction    bnc  . Hyperlipidemia   . Hypertension   . Lower urinary tract symptoms (LUTS)   . Weak urinary stream     Past Surgical History:  Procedure Laterality Date  . ADENOIDECTOMY  1960's  . APPENDECTOMY  1957  . CATARACT EXTRACTION W/ INTRAOCULAR LENS  IMPLANT, BILATERAL    . CATARACT EXTRACTION W/ INTRAOCULAR LENS  IMPLANT, BILATERAL  06/ 2013  . COLONOSCOPY  last one 01-28-2013  . GREEN LIGHT LASER TURP (TRANSURETHRAL RESECTION OF PROSTATE N/A 08/23/2015   Procedure: GREEN LIGHT LASER TURP (TRANSURETHRAL RESECTION OF PROSTATE;  Surgeon: Festus Aloe, MD;  Location: [facility];  Service: Urology;  Laterality: N/A;  . HEMORRHOID SURGERY  2006   . NASAL SEPTUM SURGERY  1965  . PROSTATE SURGERY  1999   TUNA  . TRANSURETHRAL RESECTION OF PROSTATE  05-08-2006   and  EHL  prostatic calculi    There were no vitals filed for this visit.   Subjective Assessment - 09/25/17 1017    Subjective  Patient reports increased L shoulder pain which he reports he has had " for years" but has been worsening since the beginning of 2018. Patient reports increased L shoulder pain after performing chores and heavy duty activities during the day. Patient reports increased shoulder pain with general shoulder motions. Patient reports he has recently been able to sleep on the affected side without the increase in pain. Patient reports increased shoulder pain with performing shoulder abduction with radiating pain down to the elbow on affected side. Patient reports he will take ibuprophen for the shoulder when pain is painful. Patient reports increased pain at the end of the day. Patient reports the pain in improving. 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     Pertinent History  Patient has had x3 injections in the L shoulder (last injection end of November 2018) ;LBP, a-fib    Limitations  Lifting;House hold activities    Diagnostic tests  X-Ray: + for spurs per pt report; MRI: + for "muscle tear" per pt report    Patient Stated Goals  To avoid surgery    Currently in Pain?  Yes worst pain: 4/10; 1/10    Pain Score  4     Pain Location  Shoulder    Pain Orientation  Left    Pain Descriptors / Indicators  Aching    Pain Type  Chronic pain    Pain Onset  More than a month ago    Pain Frequency  Intermittent         [facility] PT Assessment - 09/25/17 1013      Assessment   Medical Diagnosis  L shoulder pain    Referring Provider  Wyatt Portela PA    Onset Date/Surgical Date  09/24/16    Hand Dominance  Right    Next MD Visit  10/07/2017    Prior Therapy  yes      Balance Screen   Has the patient fallen in the  past 6 months  No    Has the patient had a decrease in activity level because of a fear of falling?   Yes    Is the patient reluctant to leave their home because of a fear of falling?   No      Prior Function   Level of Independence  Independent    Vocation  Retired    Biomedical scientist  N/A    Leisure  Traveling      Cognition   Overall Cognitive Status  Within Functional Limits for tasks assessed      Observation/Other Assessments   Observations  Decreased shoulder mobilization in inferior and A-P directions    Other Surveys   Other Surveys    Quick DASH   36      Sensation   Light Touch  Appears Intact      Posture/Postural Control   Posture Comments  Increased kyphosis along thoracic spine in sitting and standing      ROM / Strength   AROM / PROM / Strength  AROM;PROM;Strength      AROM   AROM Assessment Site  Shoulder    Right/Left Shoulder  Right;Left    Right Shoulder Flexion  140 Degrees    Right Shoulder ABduction  120 Degrees    Right Shoulder Internal Rotation  70 Degrees    Right Shoulder External Rotation  80 Degrees    Left Shoulder Flexion  120 Degrees    Left Shoulder ABduction  90 Degrees pain    Left Shoulder Internal Rotation  70 Degrees    Left Shoulder External Rotation  70 Degrees      PROM   PROM Assessment Site  Shoulder    Right/Left Shoulder  Right;Left    Right Shoulder Flexion  150 Degrees    Right Shoulder ABduction  130 Degrees    Left Shoulder Flexion  130 Degrees    Left Shoulder ABduction  100 Degrees      Strength   Overall Strength Comments  4-4+/5 for all shoulder movements assessed      Palpation   Palpation comment  Increased TTP along infraspinatus tendon, spasms along UT on the affected side       Reviewed current exercises being performed**  TREATMENT Therapeutic Exercise: Standing Rows with GTB -- x 10 External rotation and internal rotation on the affected shoulder -- x 10 Shoulder flexion in standing -- x 10    Patient  requires cueing for proper form/technique    Objective measurements completed on examination: See above findings.       PT Education - 09/25/17 1222    Education provided  Yes    Education Details  Weighted shoulder flexion added to HEP    Person(s) Educated  Patient    Methods  Explanation;Demonstration    Comprehension  Verbalized understanding;Returned demonstration       PT Short Term Goals - 09/25/17 1224      PT SHORT TERM GOAL #1   Title  Patient will be independent with HEP to continue benefits of therapy after discharge.    Baseline  Dependent with form/technique    Time  6    Period  Weeks    Status  New    Target Date  10/24/17      PT SHORT TERM GOAL #2   Title  --    Baseline  --    Time  --    Period  --    Status  --    Target Date  --      PT SHORT TERM GOAL #3   Title  --    Baseline  --    Time  --    Period  --    Status  --    Target Date  --        PT Long Term Goals - 09/25/17 1230      PT LONG TERM GOAL #1   Title  Patient will have a worst pain of a 1/10 in the past week to indicate improvement with performance of heavy activities.     Baseline  4/10 worst pain    Time  6    Period  Weeks    Status  New    Target Date  11/06/17      PT LONG TERM GOAL #2   Title  Patient will improve QuickDASH to under 10% to indicate functional and significant improvement with UE functioning.     Baseline  36% dysfunction    Time  6    Period  Weeks    Status  New    Target Date  11/06/17             Plan - 09/25/17 1237    Clinical Impression Statement  Patient is an 83 yo right hand dominant male presenting with increased L shoulder pain and dysfunction as indicating by increased QuickDASH scores and increased pain with performing overhead movements. Patient demonstrates increased pain congruent with impingment and rotator cuff involvement. Patient will benefit from further skilled therapy to return to prior level of  function.     History and Personal Factors relevant to plan of care:  current LBP, 3 previous injections at area of involvement, a -fib    Clinical Presentation  Stable    Clinical Presentation due to:  Patient's pain has been improving    Clinical Decision Making  Moderate    Rehab Potential  Good    Clinical Impairments Affecting Rehab Potential  (+) highly motivated, symptoms improving, (-) age, Chronicity    PT Frequency  1x / week    PT Duration  6 weeks    PT Treatment/Interventions  Electrical Stimulation;Therapeutic activities;Patient/family education;Therapeutic exercise;Neuromuscular re-education;Ultrasound;Iontophoresis 4mg /ml Dexamethasone;Cryotherapy;Moist Heat;Manual techniques;Passive range of motion;Dry needling    PT Next Visit Plan  Progress HEP    PT Home Exercise Plan  Shoulder IR/ER, Rows, Overhead shoulder flexion    Consulted and  Agree with Plan of Care  Patient       Patient will benefit from skilled therapeutic intervention in order to improve the following deficits and impairments:  Decreased mobility, Pain, Improper body mechanics, Increased muscle spasms, Decreased endurance, Decreased range of motion, Decreased activity tolerance, Decreased strength, Hypomobility, Postural dysfunction  Visit Diagnosis: Chronic left shoulder pain  Stiffness of left shoulder, not elsewhere classified  Muscle weakness (generalized)  G-Codes - October 05, 2017 1242    Functional Assessment Tool Used (Outpatient Only)  QuickDASH, MMT, AROM    Functional Limitation  Carrying, moving and handling objects    Carrying, Moving and Handling Objects Current Status (O2947)  At least 20 percent but less than 40 percent impaired, limited or restricted    Carrying, Moving and Handling Objects Goal Status (M5465)  At least 1 percent but less than 20 percent impaired, limited or restricted        Problem List Patient Active Problem List   Diagnosis Date Noted  . Hx of transurethral resection  of prostate 10/17/2015  . Benign prostatic hypertrophy with lower urinary tract symptoms (LUTS) 04/05/2015  . Chronic kidney disease (CKD), stage III (moderate) ([place]) 04/05/2015  . Hypertension goal BP (blood pressure) < 140/90 04/05/2015  . Insomnia, controlled 04/05/2015  . Arthritis, degenerative 04/05/2015  . Triggering of digit 04/05/2015  . DDD (degenerative disc disease), lumbar 10/27/2014  . Hyperlipidemia LDL goal <100 07/07/2014  . Neuritis or radiculitis due to rupture of lumbar intervertebral disc 07/07/2014  . Auditory vertigo 07/07/2014  . Enthesopathy of hip 01/05/2014    Blythe Stanford, PT DPT 10/05/17, 12:52 PM  [facility] [address], [place], [address] Phone: [phone]   Fax:  [phone]  Name: ARYAV WIMBERLY MRN: [id number] Date of Birth: 01/07/1935

## 2017-09-30 ENCOUNTER — Ambulatory Visit: Payer: Medicare HMO

## 2017-09-30 DIAGNOSIS — M25612 Stiffness of left shoulder, not elsewhere classified: Secondary | ICD-10-CM

## 2017-09-30 DIAGNOSIS — M6281 Muscle weakness (generalized): Secondary | ICD-10-CM

## 2017-09-30 DIAGNOSIS — G8929 Other chronic pain: Secondary | ICD-10-CM

## 2017-09-30 DIAGNOSIS — M25512 Pain in left shoulder: Secondary | ICD-10-CM | POA: Diagnosis not present

## 2017-09-30 NOTE — Therapy (Signed)
[facility] [address], [place], [address] Phone: [phone]   Fax:  [phone]  Physical Therapy Treatment  Patient Details  Name: [name] MRN: [id number] Date of Birth: 1935/05/24 Referring Provider: Wyatt Portela PA   Encounter Date: 09/30/2017  PT End of Session - 09/30/17 1003    Visit Number  2    Number of Visits  7    PT Start Time  0947    PT Stop Time  1030    PT Time Calculation (min)  43 min    Activity Tolerance  Patient tolerated treatment well    Behavior During Therapy  [facility] for tasks assessed/performed       Past Medical History:  Diagnosis Date  . Bladder outlet obstruction   . BPH (benign prostatic hyperplasia)   . CKD (chronic kidney disease), stage III ([place])   . DDD (degenerative disc disease), lumbar   . History of atrial fibrillation    episode PAF without current medication--  per pt happened 2002 approx.  no issues or symptoms since  . History of cardiovascular stress test    per pt done 2002 in setting of once episode PAF and was normal  . History of colon polyps   . History of gastric ulcer   . History of kidney stones   . History of urinary tract obstruction    bnc  . Hyperlipidemia   . Hypertension   . Lower urinary tract symptoms (LUTS)   . Weak urinary stream     Past Surgical History:  Procedure Laterality Date  . ADENOIDECTOMY  1960's  . APPENDECTOMY  1957  . CATARACT EXTRACTION W/ INTRAOCULAR LENS  IMPLANT, BILATERAL    . CATARACT EXTRACTION W/ INTRAOCULAR LENS  IMPLANT, BILATERAL  06/ 2013  . COLONOSCOPY  last one 01-28-2013  . GREEN LIGHT LASER TURP (TRANSURETHRAL RESECTION OF PROSTATE N/A 08/23/2015   Procedure: GREEN LIGHT LASER TURP (TRANSURETHRAL RESECTION OF PROSTATE;  Surgeon: Festus Aloe, MD;  Location: [facility];  Service: Urology;  Laterality: N/A;  . HEMORRHOID SURGERY  2006  . NASAL SEPTUM SURGERY  1965  .  PROSTATE SURGERY  1999   TUNA  . TRANSURETHRAL RESECTION OF PROSTATE  05-08-2006   and  [facility]  prostatic calculi    There were no vitals filed for this visit.  Subjective Assessment - 09/30/17 0952    Subjective  Patient reports his pain is improved today and states he's been able to perform exercises at home.     Pertinent History  Patient has had x3 injections in the L shoulder (last injection end of November 2018) ;LBP, a-fib    Limitations  Lifting;House hold activities    Diagnostic tests  X-Ray: + for spurs per pt report; MRI: + for "muscle tear" per pt report    Patient Stated Goals  To avoid surgery    Currently in Pain?  Yes    Pain Score  4     Pain Location  Shoulder    Pain Orientation  Left    Pain Descriptors / Indicators  Aching    Pain Type  Chronic pain    Pain Onset  More than a month ago    Pain Frequency  Intermittent       TREATMENT Manual Therapy: STM to the infraspinatus with patient positioned in sitting to decrease increased spasms and pain.  Mobilizations grade III/IV on levels C7-T4 for 3 x  30 sec to improve mobility of the thoracic spine and increase overhead motion in standing.   Therapeutic Exercise: B shoulder ER with GTB - x 20  Standing with shoulder @45deg  abd performing shoulder ER - x20 Overhead bodyblade ant/post rhythmic stabilization - 2 x 10 sec Overhead shoulder flexion with foam roller facing the wall x-x15 Scapular retraction/ rows at [facility] - 15# x20  Patient reports decreased pain at end of session   PT Education - 09/30/17 1002    Education provided  Yes    Education Details  form/technique with exercise    Person(s) Educated  Patient    Methods  Explanation;Demonstration    Comprehension  Verbalized understanding;Returned demonstration       PT Short Term Goals - 09/25/17 1224      PT SHORT TERM GOAL #1   Title  Patient will be independent with HEP to continue benefits of therapy after discharge.    Baseline  Dependent  with form/technique    Time  6    Period  Weeks    Status  New    Target Date  10/24/17      PT SHORT TERM GOAL #2   Title  --    Baseline  --    Time  --    Period  --    Status  --    Target Date  --      PT SHORT TERM GOAL #3   Title  --    Baseline  --    Time  --    Period  --    Status  --    Target Date  --        PT Long Term Goals - 09/25/17 1230      PT LONG TERM GOAL #1   Title  Patient will have a worst pain of a 1/10 in the past week to indicate improvement with performance of heavy activities.     Baseline  4/10 worst pain    Time  6    Period  Weeks    Status  New    Target Date  11/06/17      PT LONG TERM GOAL #2   Title  Patient will improve QuickDASH to under 10% to indicate functional and significant improvement with UE functioning.     Baseline  36% dysfunction    Time  6    Period  Weeks    Status  New    Target Date  11/06/17            Plan - 09/30/17 1004    Clinical Impression Statement  Patient demonstrates improvement in shoulder flexion with exercise. Although patient is improving, he continues to have decreased AROM for shoulder ER, flexion, and abduction with pain at end range. Patient will benefit from further skilled therapy to return to prior level of function.     Rehab Potential  Good    Clinical Impairments Affecting Rehab Potential  (+) highly motivated, symptoms improving, (-) age, Chronicity    PT Frequency  1x / week    PT Duration  6 weeks    PT Treatment/Interventions  Electrical Stimulation;Therapeutic activities;Patient/family education;Therapeutic exercise;Neuromuscular re-education;Ultrasound;Iontophoresis 4mg /ml Dexamethasone;Cryotherapy;Moist Heat;Manual techniques;Passive range of motion;Dry needling    PT Next Visit Plan  Progress Strengthening     PT Home Exercise Plan  Shoulder IR/ER, Rows, Overhead shoulder flexion    Consulted and Agree with Plan of Care  Patient  Patient will benefit from  skilled therapeutic intervention in order to improve the following deficits and impairments:  Decreased mobility, Pain, Improper body mechanics, Increased muscle spasms, Decreased endurance, Decreased range of motion, Decreased activity tolerance, Decreased strength, Hypomobility, Postural dysfunction  Visit Diagnosis: Chronic left shoulder pain  Stiffness of left shoulder, not elsewhere classified  Muscle weakness (generalized)     Problem List Patient Active Problem List   Diagnosis Date Noted  . Hx of transurethral resection of prostate 10/17/2015  . Benign prostatic hypertrophy with lower urinary tract symptoms (LUTS) 04/05/2015  . Chronic kidney disease (CKD), stage III (moderate) ([place]) 04/05/2015  . Hypertension goal BP (blood pressure) < 140/90 04/05/2015  . Insomnia, controlled 04/05/2015  . Arthritis, degenerative 04/05/2015  . Triggering of digit 04/05/2015  . DDD (degenerative disc disease), lumbar 10/27/2014  . Hyperlipidemia LDL goal <100 07/07/2014  . Neuritis or radiculitis due to rupture of lumbar intervertebral disc 07/07/2014  . Auditory vertigo 07/07/2014  . Enthesopathy of hip 01/05/2014    Blythe Stanford, PT DPT 09/30/2017, 11:11 AM  [facility] [address], [place], [address] Phone: [phone]   Fax:  [phone]  Name: [name] MRN: [id number] Date of Birth: 1935-06-28

## 2017-10-02 ENCOUNTER — Ambulatory Visit: Payer: Medicare HMO

## 2017-10-07 DIAGNOSIS — M25512 Pain in left shoulder: Secondary | ICD-10-CM | POA: Diagnosis not present

## 2017-10-07 DIAGNOSIS — S43432D Superior glenoid labrum lesion of left shoulder, subsequent encounter: Secondary | ICD-10-CM | POA: Diagnosis not present

## 2017-10-08 ENCOUNTER — Ambulatory Visit: Payer: Medicare HMO

## 2017-10-09 DIAGNOSIS — B354 Tinea corporis: Secondary | ICD-10-CM | POA: Diagnosis not present

## 2017-10-09 DIAGNOSIS — L820 Inflamed seborrheic keratosis: Secondary | ICD-10-CM | POA: Diagnosis not present

## 2017-10-09 DIAGNOSIS — B356 Tinea cruris: Secondary | ICD-10-CM | POA: Diagnosis not present

## 2017-10-15 DIAGNOSIS — M7542 Impingement syndrome of left shoulder: Secondary | ICD-10-CM | POA: Diagnosis not present

## 2017-10-15 DIAGNOSIS — S43432D Superior glenoid labrum lesion of left shoulder, subsequent encounter: Secondary | ICD-10-CM | POA: Diagnosis not present

## 2017-11-20 DIAGNOSIS — B354 Tinea corporis: Secondary | ICD-10-CM | POA: Diagnosis not present

## 2017-11-20 DIAGNOSIS — L905 Scar conditions and fibrosis of skin: Secondary | ICD-10-CM | POA: Diagnosis not present

## 2017-11-20 DIAGNOSIS — B356 Tinea cruris: Secondary | ICD-10-CM | POA: Diagnosis not present

## 2017-12-04 DIAGNOSIS — E782 Mixed hyperlipidemia: Secondary | ICD-10-CM | POA: Diagnosis not present

## 2017-12-04 DIAGNOSIS — Z79899 Other long term (current) drug therapy: Secondary | ICD-10-CM | POA: Diagnosis not present

## 2017-12-04 DIAGNOSIS — Z125 Encounter for screening for malignant neoplasm of prostate: Secondary | ICD-10-CM | POA: Diagnosis not present

## 2017-12-17 DIAGNOSIS — I480 Paroxysmal atrial fibrillation: Secondary | ICD-10-CM | POA: Diagnosis not present

## 2017-12-17 DIAGNOSIS — Z79899 Other long term (current) drug therapy: Secondary | ICD-10-CM | POA: Diagnosis not present

## 2017-12-17 DIAGNOSIS — Z0000 Encounter for general adult medical examination without abnormal findings: Secondary | ICD-10-CM | POA: Diagnosis not present

## 2017-12-17 DIAGNOSIS — E782 Mixed hyperlipidemia: Secondary | ICD-10-CM | POA: Diagnosis not present

## 2017-12-20 DIAGNOSIS — H43811 Vitreous degeneration, right eye: Secondary | ICD-10-CM | POA: Diagnosis not present

## 2017-12-20 DIAGNOSIS — H26493 Other secondary cataract, bilateral: Secondary | ICD-10-CM | POA: Diagnosis not present

## 2017-12-20 DIAGNOSIS — H01001 Unspecified blepharitis right upper eyelid: Secondary | ICD-10-CM | POA: Diagnosis not present

## 2017-12-20 DIAGNOSIS — H02831 Dermatochalasis of right upper eyelid: Secondary | ICD-10-CM | POA: Diagnosis not present

## 2018-03-05 DIAGNOSIS — L2389 Allergic contact dermatitis due to other agents: Secondary | ICD-10-CM | POA: Diagnosis not present

## 2018-04-24 DIAGNOSIS — L2389 Allergic contact dermatitis due to other agents: Secondary | ICD-10-CM | POA: Diagnosis not present

## 2018-04-24 DIAGNOSIS — M5416 Radiculopathy, lumbar region: Secondary | ICD-10-CM | POA: Diagnosis not present

## 2018-04-24 DIAGNOSIS — M5136 Other intervertebral disc degeneration, lumbar region: Secondary | ICD-10-CM | POA: Diagnosis not present

## 2018-06-10 DIAGNOSIS — E782 Mixed hyperlipidemia: Secondary | ICD-10-CM | POA: Diagnosis not present

## 2018-06-17 DIAGNOSIS — L570 Actinic keratosis: Secondary | ICD-10-CM | POA: Diagnosis not present

## 2018-06-17 DIAGNOSIS — E782 Mixed hyperlipidemia: Secondary | ICD-10-CM | POA: Diagnosis not present

## 2018-06-17 DIAGNOSIS — I480 Paroxysmal atrial fibrillation: Secondary | ICD-10-CM | POA: Diagnosis not present

## 2018-06-17 DIAGNOSIS — Z125 Encounter for screening for malignant neoplasm of prostate: Secondary | ICD-10-CM | POA: Diagnosis not present

## 2018-06-17 DIAGNOSIS — N183 Chronic kidney disease, stage 3 (moderate): Secondary | ICD-10-CM | POA: Diagnosis not present

## 2018-06-17 DIAGNOSIS — M5416 Radiculopathy, lumbar region: Secondary | ICD-10-CM | POA: Diagnosis not present

## 2018-06-17 DIAGNOSIS — Z79899 Other long term (current) drug therapy: Secondary | ICD-10-CM | POA: Diagnosis not present

## 2018-06-24 DIAGNOSIS — M5416 Radiculopathy, lumbar region: Secondary | ICD-10-CM | POA: Diagnosis not present

## 2018-06-24 DIAGNOSIS — M5136 Other intervertebral disc degeneration, lumbar region: Secondary | ICD-10-CM | POA: Diagnosis not present

## 2018-12-12 DIAGNOSIS — Z125 Encounter for screening for malignant neoplasm of prostate: Secondary | ICD-10-CM | POA: Diagnosis not present

## 2018-12-12 DIAGNOSIS — Z79899 Other long term (current) drug therapy: Secondary | ICD-10-CM | POA: Diagnosis not present

## 2018-12-12 DIAGNOSIS — E782 Mixed hyperlipidemia: Secondary | ICD-10-CM | POA: Diagnosis not present

## 2018-12-19 DIAGNOSIS — Z0000 Encounter for general adult medical examination without abnormal findings: Secondary | ICD-10-CM | POA: Diagnosis not present

## 2018-12-19 DIAGNOSIS — E782 Mixed hyperlipidemia: Secondary | ICD-10-CM | POA: Diagnosis not present

## 2018-12-19 DIAGNOSIS — I480 Paroxysmal atrial fibrillation: Secondary | ICD-10-CM | POA: Diagnosis not present

## 2019-01-29 DIAGNOSIS — L280 Lichen simplex chronicus: Secondary | ICD-10-CM | POA: Diagnosis not present

## 2019-01-29 DIAGNOSIS — L821 Other seborrheic keratosis: Secondary | ICD-10-CM | POA: Diagnosis not present

## 2019-01-29 DIAGNOSIS — R21 Rash and other nonspecific skin eruption: Secondary | ICD-10-CM | POA: Diagnosis not present

## 2019-02-09 DIAGNOSIS — H26493 Other secondary cataract, bilateral: Secondary | ICD-10-CM | POA: Diagnosis not present

## 2019-06-17 DIAGNOSIS — E782 Mixed hyperlipidemia: Secondary | ICD-10-CM | POA: Diagnosis not present

## 2019-06-23 DIAGNOSIS — M5416 Radiculopathy, lumbar region: Secondary | ICD-10-CM | POA: Diagnosis not present

## 2019-06-23 DIAGNOSIS — M5136 Other intervertebral disc degeneration, lumbar region: Secondary | ICD-10-CM | POA: Diagnosis not present

## 2019-06-24 DIAGNOSIS — I10 Essential (primary) hypertension: Secondary | ICD-10-CM | POA: Diagnosis not present

## 2019-06-24 DIAGNOSIS — M5136 Other intervertebral disc degeneration, lumbar region: Secondary | ICD-10-CM | POA: Diagnosis not present

## 2019-06-24 DIAGNOSIS — G4700 Insomnia, unspecified: Secondary | ICD-10-CM | POA: Diagnosis not present

## 2019-06-24 DIAGNOSIS — Z87891 Personal history of nicotine dependence: Secondary | ICD-10-CM | POA: Diagnosis not present

## 2019-06-24 DIAGNOSIS — Z23 Encounter for immunization: Secondary | ICD-10-CM | POA: Diagnosis not present

## 2019-08-11 DIAGNOSIS — L820 Inflamed seborrheic keratosis: Secondary | ICD-10-CM | POA: Diagnosis not present

## 2019-08-11 DIAGNOSIS — L853 Xerosis cutis: Secondary | ICD-10-CM | POA: Diagnosis not present

## 2019-08-11 DIAGNOSIS — D485 Neoplasm of uncertain behavior of skin: Secondary | ICD-10-CM | POA: Diagnosis not present

## 2019-08-11 DIAGNOSIS — L570 Actinic keratosis: Secondary | ICD-10-CM | POA: Diagnosis not present

## 2019-08-11 DIAGNOSIS — C44629 Squamous cell carcinoma of skin of left upper limb, including shoulder: Secondary | ICD-10-CM | POA: Diagnosis not present

## 2019-09-03 DIAGNOSIS — C44629 Squamous cell carcinoma of skin of left upper limb, including shoulder: Secondary | ICD-10-CM | POA: Diagnosis not present

## 2019-09-03 DIAGNOSIS — D0462 Carcinoma in situ of skin of left upper limb, including shoulder: Secondary | ICD-10-CM | POA: Diagnosis not present

## 2019-09-14 DIAGNOSIS — L570 Actinic keratosis: Secondary | ICD-10-CM | POA: Diagnosis not present

## 2019-09-14 DIAGNOSIS — D485 Neoplasm of uncertain behavior of skin: Secondary | ICD-10-CM | POA: Diagnosis not present

## 2019-09-14 DIAGNOSIS — C44622 Squamous cell carcinoma of skin of right upper limb, including shoulder: Secondary | ICD-10-CM | POA: Diagnosis not present

## 2019-09-14 DIAGNOSIS — L905 Scar conditions and fibrosis of skin: Secondary | ICD-10-CM | POA: Diagnosis not present

## 2019-10-14 DIAGNOSIS — C44622 Squamous cell carcinoma of skin of right upper limb, including shoulder: Secondary | ICD-10-CM | POA: Diagnosis not present

## 2019-10-19 DIAGNOSIS — H43811 Vitreous degeneration, right eye: Secondary | ICD-10-CM | POA: Diagnosis not present

## 2019-10-19 DIAGNOSIS — H26493 Other secondary cataract, bilateral: Secondary | ICD-10-CM | POA: Diagnosis not present

## 2019-11-03 DIAGNOSIS — I4891 Unspecified atrial fibrillation: Secondary | ICD-10-CM | POA: Diagnosis not present

## 2019-11-03 DIAGNOSIS — H6501 Acute serous otitis media, right ear: Secondary | ICD-10-CM | POA: Diagnosis not present

## 2019-12-16 DIAGNOSIS — E538 Deficiency of other specified B group vitamins: Secondary | ICD-10-CM | POA: Diagnosis not present

## 2019-12-16 DIAGNOSIS — Z125 Encounter for screening for malignant neoplasm of prostate: Secondary | ICD-10-CM | POA: Diagnosis not present

## 2019-12-16 DIAGNOSIS — E782 Mixed hyperlipidemia: Secondary | ICD-10-CM | POA: Diagnosis not present

## 2019-12-23 DIAGNOSIS — Z0000 Encounter for general adult medical examination without abnormal findings: Secondary | ICD-10-CM | POA: Diagnosis not present

## 2019-12-23 DIAGNOSIS — N400 Enlarged prostate without lower urinary tract symptoms: Secondary | ICD-10-CM | POA: Diagnosis not present

## 2019-12-23 DIAGNOSIS — Z87891 Personal history of nicotine dependence: Secondary | ICD-10-CM | POA: Diagnosis not present

## 2019-12-23 DIAGNOSIS — H8101 Meniere's disease, right ear: Secondary | ICD-10-CM | POA: Diagnosis not present

## 2019-12-23 DIAGNOSIS — R42 Dizziness and giddiness: Secondary | ICD-10-CM | POA: Diagnosis not present

## 2019-12-23 DIAGNOSIS — I4891 Unspecified atrial fibrillation: Secondary | ICD-10-CM | POA: Diagnosis not present

## 2019-12-23 DIAGNOSIS — M5136 Other intervertebral disc degeneration, lumbar region: Secondary | ICD-10-CM | POA: Diagnosis not present

## 2020-04-12 DIAGNOSIS — L821 Other seborrheic keratosis: Secondary | ICD-10-CM | POA: Diagnosis not present

## 2020-04-12 DIAGNOSIS — Z859 Personal history of malignant neoplasm, unspecified: Secondary | ICD-10-CM | POA: Diagnosis not present

## 2020-04-12 DIAGNOSIS — B009 Herpesviral infection, unspecified: Secondary | ICD-10-CM | POA: Diagnosis not present

## 2020-04-12 DIAGNOSIS — L281 Prurigo nodularis: Secondary | ICD-10-CM | POA: Diagnosis not present

## 2020-04-12 DIAGNOSIS — L570 Actinic keratosis: Secondary | ICD-10-CM | POA: Diagnosis not present

## 2020-04-12 DIAGNOSIS — Z872 Personal history of diseases of the skin and subcutaneous tissue: Secondary | ICD-10-CM | POA: Diagnosis not present

## 2020-04-12 DIAGNOSIS — L578 Other skin changes due to chronic exposure to nonionizing radiation: Secondary | ICD-10-CM | POA: Diagnosis not present

## 2020-06-17 DIAGNOSIS — E782 Mixed hyperlipidemia: Secondary | ICD-10-CM | POA: Diagnosis not present

## 2020-06-24 DIAGNOSIS — M5416 Radiculopathy, lumbar region: Secondary | ICD-10-CM | POA: Diagnosis not present

## 2020-06-24 DIAGNOSIS — E782 Mixed hyperlipidemia: Secondary | ICD-10-CM | POA: Diagnosis not present

## 2020-06-24 DIAGNOSIS — Z23 Encounter for immunization: Secondary | ICD-10-CM | POA: Diagnosis not present

## 2020-06-24 DIAGNOSIS — Z125 Encounter for screening for malignant neoplasm of prostate: Secondary | ICD-10-CM | POA: Diagnosis not present

## 2020-06-24 DIAGNOSIS — R0600 Dyspnea, unspecified: Secondary | ICD-10-CM | POA: Diagnosis not present

## 2020-07-21 DIAGNOSIS — G4700 Insomnia, unspecified: Secondary | ICD-10-CM | POA: Diagnosis not present

## 2020-07-21 DIAGNOSIS — M2550 Pain in unspecified joint: Secondary | ICD-10-CM | POA: Diagnosis not present

## 2020-08-22 DIAGNOSIS — J189 Pneumonia, unspecified organism: Secondary | ICD-10-CM | POA: Diagnosis not present

## 2020-08-22 DIAGNOSIS — J1282 Pneumonia due to coronavirus disease 2019: Secondary | ICD-10-CM | POA: Diagnosis not present

## 2020-08-22 DIAGNOSIS — J9811 Atelectasis: Secondary | ICD-10-CM | POA: Diagnosis not present

## 2020-08-22 DIAGNOSIS — I4891 Unspecified atrial fibrillation: Secondary | ICD-10-CM | POA: Diagnosis not present

## 2020-08-22 DIAGNOSIS — U071 COVID-19: Secondary | ICD-10-CM | POA: Diagnosis not present

## 2020-10-19 DIAGNOSIS — I4891 Unspecified atrial fibrillation: Secondary | ICD-10-CM | POA: Diagnosis not present

## 2020-10-19 DIAGNOSIS — Z8719 Personal history of other diseases of the digestive system: Secondary | ICD-10-CM | POA: Diagnosis not present

## 2020-10-19 DIAGNOSIS — Z8616 Personal history of COVID-19: Secondary | ICD-10-CM | POA: Diagnosis not present

## 2020-11-14 DIAGNOSIS — H26493 Other secondary cataract, bilateral: Secondary | ICD-10-CM | POA: Diagnosis not present

## 2020-12-27 DIAGNOSIS — E782 Mixed hyperlipidemia: Secondary | ICD-10-CM | POA: Diagnosis not present

## 2020-12-27 DIAGNOSIS — Z125 Encounter for screening for malignant neoplasm of prostate: Secondary | ICD-10-CM | POA: Diagnosis not present

## 2021-01-03 DIAGNOSIS — I4891 Unspecified atrial fibrillation: Secondary | ICD-10-CM | POA: Diagnosis not present

## 2021-01-03 DIAGNOSIS — M519 Unspecified thoracic, thoracolumbar and lumbosacral intervertebral disc disorder: Secondary | ICD-10-CM | POA: Diagnosis not present

## 2021-01-03 DIAGNOSIS — Z0000 Encounter for general adult medical examination without abnormal findings: Secondary | ICD-10-CM | POA: Diagnosis not present

## 2021-02-02 DIAGNOSIS — L570 Actinic keratosis: Secondary | ICD-10-CM | POA: Diagnosis not present

## 2021-03-02 DIAGNOSIS — M5416 Radiculopathy, lumbar region: Secondary | ICD-10-CM | POA: Diagnosis not present

## 2021-03-02 DIAGNOSIS — M48062 Spinal stenosis, lumbar region with neurogenic claudication: Secondary | ICD-10-CM | POA: Diagnosis not present

## 2021-04-12 DIAGNOSIS — Z872 Personal history of diseases of the skin and subcutaneous tissue: Secondary | ICD-10-CM | POA: Diagnosis not present

## 2021-04-12 DIAGNOSIS — L821 Other seborrheic keratosis: Secondary | ICD-10-CM | POA: Diagnosis not present

## 2021-04-12 DIAGNOSIS — L570 Actinic keratosis: Secondary | ICD-10-CM | POA: Diagnosis not present

## 2021-04-12 DIAGNOSIS — L578 Other skin changes due to chronic exposure to nonionizing radiation: Secondary | ICD-10-CM | POA: Diagnosis not present

## 2021-04-12 DIAGNOSIS — D485 Neoplasm of uncertain behavior of skin: Secondary | ICD-10-CM | POA: Diagnosis not present

## 2021-04-12 DIAGNOSIS — Z859 Personal history of malignant neoplasm, unspecified: Secondary | ICD-10-CM | POA: Diagnosis not present

## 2021-04-12 DIAGNOSIS — C44629 Squamous cell carcinoma of skin of left upper limb, including shoulder: Secondary | ICD-10-CM | POA: Diagnosis not present

## 2021-05-01 DIAGNOSIS — C44629 Squamous cell carcinoma of skin of left upper limb, including shoulder: Secondary | ICD-10-CM | POA: Diagnosis not present

## 2021-07-05 DIAGNOSIS — M5416 Radiculopathy, lumbar region: Secondary | ICD-10-CM | POA: Diagnosis not present

## 2021-07-05 DIAGNOSIS — R0609 Other forms of dyspnea: Secondary | ICD-10-CM | POA: Diagnosis not present

## 2021-07-05 DIAGNOSIS — R0600 Dyspnea, unspecified: Secondary | ICD-10-CM | POA: Diagnosis not present

## 2021-07-05 DIAGNOSIS — Z23 Encounter for immunization: Secondary | ICD-10-CM | POA: Diagnosis not present

## 2021-07-05 DIAGNOSIS — Z125 Encounter for screening for malignant neoplasm of prostate: Secondary | ICD-10-CM | POA: Diagnosis not present

## 2021-07-05 DIAGNOSIS — Z8679 Personal history of other diseases of the circulatory system: Secondary | ICD-10-CM | POA: Diagnosis not present

## 2021-07-19 DIAGNOSIS — M25552 Pain in left hip: Secondary | ICD-10-CM | POA: Diagnosis not present

## 2021-07-19 DIAGNOSIS — J8410 Pulmonary fibrosis, unspecified: Secondary | ICD-10-CM | POA: Diagnosis not present

## 2021-08-07 DIAGNOSIS — M7062 Trochanteric bursitis, left hip: Secondary | ICD-10-CM | POA: Diagnosis not present

## 2022-06-12 ENCOUNTER — Emergency Department (HOSPITAL_COMMUNITY)
Admission: EM | Admit: 2022-06-12 | Discharge: 2022-06-12 | Disposition: A | Discharge status: Home / Self Care | Payer: Medicare Other | Attending: Emergency Medicine | Admitting: Emergency Medicine

## 2022-06-12 ENCOUNTER — Other Ambulatory Visit: Payer: Self-pay

## 2022-06-12 ENCOUNTER — Encounter (HOSPITAL_COMMUNITY): Payer: Self-pay | Admitting: Emergency Medicine

## 2022-06-12 DIAGNOSIS — M545 Low back pain, unspecified: Secondary | ICD-10-CM | POA: Diagnosis present

## 2022-06-12 DIAGNOSIS — Z7982 Long term (current) use of aspirin: Secondary | ICD-10-CM | POA: Insufficient documentation

## 2022-06-12 DIAGNOSIS — I129 Hypertensive chronic kidney disease with stage 1 through stage 4 chronic kidney disease, or unspecified chronic kidney disease: Secondary | ICD-10-CM | POA: Insufficient documentation

## 2022-06-12 DIAGNOSIS — N189 Chronic kidney disease, unspecified: Secondary | ICD-10-CM | POA: Diagnosis not present

## 2022-06-12 DIAGNOSIS — G894 Chronic pain syndrome: Secondary | ICD-10-CM | POA: Insufficient documentation

## 2022-06-12 DIAGNOSIS — Z79899 Other long term (current) drug therapy: Secondary | ICD-10-CM | POA: Diagnosis not present

## 2022-06-12 DIAGNOSIS — I10 Essential (primary) hypertension: Secondary | ICD-10-CM

## 2022-06-12 MED ORDER — TRAMADOL HCL 50 MG PO TABS: 50.0000 mg | ORAL_TABLET | Freq: Once | ORAL | Status: DC

## 2022-06-12 MED ORDER — TRAMADOL HCL 50 MG PO TABS
100.0000 mg | ORAL_TABLET | Freq: Once | ORAL | Status: AC
  Administered 2022-06-12: 100 mg via ORAL
  Filled 2022-06-12: qty 2

## 2022-06-12 MED ORDER — TRAMADOL HCL 50 MG PO TABS: 100.0000 mg | ORAL_TABLET | Freq: Four times a day (QID) | ORAL | 0 refills | Status: AC | PRN

## 2022-06-12 NOTE — ED Provider Notes (Signed)
[facility] EMERGENCY DEPARTMENT Provider Note   CSN: [id number] Arrival date & time: 06/12/22  0329     History  Chief Complaint  Patient presents with   Generalized Body Aches    [name] is a 87 y.o. male.  The history is provided by the patient.  He has history of hypertension, hyperlipidemia, chronic kidney disease and comes in because of exacerbation of chronic pain.  He normally takes tramadol, 2 tablets at a time 3 times a day.  He ran out of his tramadol 4 days ago, and there was a mixup with his mail order pharmacy and he is not likely to get his mail order refill for another 5 days.  He is complaining of worsening of his chronic pain across his lower back.   Home Medications Prior to Admission medications   Medication Sig Start Date End Date Taking? Authorizing Provider  ALPRAZolam Duanne Moron) 0.5 MG tablet Take 1 tablet (0.5 mg total) by mouth at bedtime as needed for anxiety. 10/17/15   Bobetta Lime, MD  aspirin EC 81 MG tablet Take 81 mg by mouth daily.    [provider]  lisinopril (PRINIVIL,ZESTRIL) 20 MG tablet Take 1 tablet (20 mg total) by mouth every morning. 10/10/15   Bobetta Lime, MD  Misc Natural Products (GLUCOSAMINE CHONDROITIN MSM PO) Take 2 tablets by mouth every morning.    [provider]  Omega-3 Fatty Acids (FISH OIL) 1000 MG CAPS Take 1 capsule by mouth every morning.    [provider]  Polyethylene Glycol 3350 (MIRALAX PO) Take by mouth as needed.    [provider]  Saw Palmetto, Serenoa repens, (SAW PALMETTO PO) Take 1 capsule by mouth every morning.    [provider]  traMADol (ULTRAM) 50 MG tablet Take 2 tablets (100 mg total) by mouth every 8 (eight) hours as needed for severe pain. 04/05/15   Bobetta Lime, MD      Allergies    Patient has no known allergies.    Review of Systems   Review of Systems  All other systems reviewed and are negative.   Physical Exam Updated Vital  Signs BP (!) 207/89   Pulse (!) 56   Temp 97.8 F (36.6 C)   Resp 18   Ht '5\' 11"'$  (1.803 m)   Wt 85 kg   SpO2 100%   BMI 26.14 kg/m  Physical Exam Vitals and nursing note reviewed.   87 year old male, resting comfortably and in no acute distress. Vital signs are significant for elevated blood pressure and borderline slow heart rate. Oxygen saturation is 100%, which is normal. Head is normocephalic and atraumatic. PERRLA, EOMI. Oropharynx is clear. Neck is nontender and supple without adenopathy or JVD. Lungs are clear without rales, wheezes, or rhonchi. Chest is nontender. Heart has regular rate and rhythm without murmur. Abdomen is soft, flat, nontender. Extremities have no cyanosis or edema, full range of motion is present. Skin is warm and dry without rash. Neurologic: Mental status is normal, cranial nerves are intact, moves all extremities equally.  ED Results / Procedures / Treatments    Procedures Procedures    Medications Ordered in ED Medications  traMADol (ULTRAM) tablet 100 mg (has no administration in time range)    ED Course/ Medical Decision Making/ A&P                           Medical Decision Making  Exacerbation of  chronic pain secondary to running out of his narcotic pain medication.  I reviewed his record on the [place] controlled substance reporting website confirming that he gets 180 mg of tramadol filled monthly.  I have ordered a dose of oral tramadol here and have agreed to prescribe 6 doses of tramadol to last him until he can make arrangements with his primary care provider to get a bridge prescription until the mail order arrives.  Also, patient is advised that his blood pressure is very high.  He will need to monitor this as an outpatient as blood pressure is likely significantly elevated because of pain.  I have told him to monitor his blood pressure daily and keep a record of it and take that record with him when he sees his primary care  provider.  Final Clinical Impression(s) / ED Diagnoses Final diagnoses:  Chronic pain syndrome  Elevated blood pressure reading with diagnosis of hypertension    Rx / DC Orders ED Discharge Orders          Ordered    traMADol (ULTRAM) 50 MG tablet  Every 6 hours PRN        06/12/22 7673              Delora Fuel, MD 41/93/79 [phone]

## 2022-06-12 NOTE — ED Triage Notes (Signed)
Pt is out of tramadol since Friday and has pain in lower back and legs.

## 2022-06-12 NOTE — Discharge Instructions (Signed)
Please contact your primary care provider to get a prescription for tramadol to last until the mail order can arrive.  In the future, make sure that you leave this is sufficient time to get prescription refills so that you are not without medication if there is a problem.  Your blood pressure was very high today.  That may be because you are in pain, but it could be because your blood pressure is not adequately controlled.  Please check your blood pressure every day and keep a record of it.  Take that record with you when you see your primary care provider.

## 2023-10-22 DIAGNOSIS — D485 Neoplasm of uncertain behavior of skin: Secondary | ICD-10-CM | POA: Diagnosis not present

## 2023-10-22 DIAGNOSIS — L570 Actinic keratosis: Secondary | ICD-10-CM | POA: Diagnosis not present

## 2023-10-22 DIAGNOSIS — C44629 Squamous cell carcinoma of skin of left upper limb, including shoulder: Secondary | ICD-10-CM | POA: Diagnosis not present

## 2023-10-22 DIAGNOSIS — L578 Other skin changes due to chronic exposure to nonionizing radiation: Secondary | ICD-10-CM | POA: Diagnosis not present

## 2023-10-22 DIAGNOSIS — M48062 Spinal stenosis, lumbar region with neurogenic claudication: Secondary | ICD-10-CM | POA: Diagnosis not present

## 2023-10-22 DIAGNOSIS — D1801 Hemangioma of skin and subcutaneous tissue: Secondary | ICD-10-CM | POA: Diagnosis not present

## 2023-10-22 DIAGNOSIS — M5416 Radiculopathy, lumbar region: Secondary | ICD-10-CM | POA: Diagnosis not present

## 2023-11-18 DIAGNOSIS — M5416 Radiculopathy, lumbar region: Secondary | ICD-10-CM | POA: Diagnosis not present

## 2023-11-18 DIAGNOSIS — M48062 Spinal stenosis, lumbar region with neurogenic claudication: Secondary | ICD-10-CM | POA: Diagnosis not present

## 2023-11-28 DIAGNOSIS — C44619 Basal cell carcinoma of skin of left upper limb, including shoulder: Secondary | ICD-10-CM | POA: Diagnosis not present

## 2023-11-28 DIAGNOSIS — C44629 Squamous cell carcinoma of skin of left upper limb, including shoulder: Secondary | ICD-10-CM | POA: Diagnosis not present

## 2024-01-01 DIAGNOSIS — R739 Hyperglycemia, unspecified: Secondary | ICD-10-CM | POA: Diagnosis not present

## 2024-01-01 DIAGNOSIS — Z125 Encounter for screening for malignant neoplasm of prostate: Secondary | ICD-10-CM | POA: Diagnosis not present

## 2024-01-01 DIAGNOSIS — E782 Mixed hyperlipidemia: Secondary | ICD-10-CM | POA: Diagnosis not present

## 2024-01-08 DIAGNOSIS — J8410 Pulmonary fibrosis, unspecified: Secondary | ICD-10-CM | POA: Diagnosis not present

## 2024-01-08 DIAGNOSIS — E782 Mixed hyperlipidemia: Secondary | ICD-10-CM | POA: Diagnosis not present

## 2024-01-08 DIAGNOSIS — C4331 Malignant melanoma of nose: Secondary | ICD-10-CM | POA: Diagnosis not present

## 2024-01-08 DIAGNOSIS — Z0000 Encounter for general adult medical examination without abnormal findings: Secondary | ICD-10-CM | POA: Diagnosis not present

## 2024-01-08 DIAGNOSIS — Z79899 Other long term (current) drug therapy: Secondary | ICD-10-CM | POA: Diagnosis not present

## 2024-01-08 DIAGNOSIS — I480 Paroxysmal atrial fibrillation: Secondary | ICD-10-CM | POA: Diagnosis not present

## 2024-03-02 DIAGNOSIS — M2242 Chondromalacia patellae, left knee: Secondary | ICD-10-CM | POA: Diagnosis not present

## 2024-03-02 DIAGNOSIS — M170 Bilateral primary osteoarthritis of knee: Secondary | ICD-10-CM | POA: Diagnosis not present

## 2024-03-02 DIAGNOSIS — M2241 Chondromalacia patellae, right knee: Secondary | ICD-10-CM | POA: Diagnosis not present

## 2024-03-16 DIAGNOSIS — H26491 Other secondary cataract, right eye: Secondary | ICD-10-CM | POA: Diagnosis not present

## 2024-05-11 DIAGNOSIS — Z85828 Personal history of other malignant neoplasm of skin: Secondary | ICD-10-CM | POA: Diagnosis not present

## 2024-05-11 DIAGNOSIS — L821 Other seborrheic keratosis: Secondary | ICD-10-CM | POA: Diagnosis not present

## 2024-05-11 DIAGNOSIS — D485 Neoplasm of uncertain behavior of skin: Secondary | ICD-10-CM | POA: Diagnosis not present

## 2024-05-11 DIAGNOSIS — D2261 Melanocytic nevi of right upper limb, including shoulder: Secondary | ICD-10-CM | POA: Diagnosis not present

## 2024-05-11 DIAGNOSIS — D225 Melanocytic nevi of trunk: Secondary | ICD-10-CM | POA: Diagnosis not present

## 2024-05-11 DIAGNOSIS — L820 Inflamed seborrheic keratosis: Secondary | ICD-10-CM | POA: Diagnosis not present

## 2024-05-11 DIAGNOSIS — D2272 Melanocytic nevi of left lower limb, including hip: Secondary | ICD-10-CM | POA: Diagnosis not present

## 2024-05-11 DIAGNOSIS — Z08 Encounter for follow-up examination after completed treatment for malignant neoplasm: Secondary | ICD-10-CM | POA: Diagnosis not present

## 2024-05-11 DIAGNOSIS — D2271 Melanocytic nevi of right lower limb, including hip: Secondary | ICD-10-CM | POA: Diagnosis not present

## 2024-05-11 DIAGNOSIS — D0461 Carcinoma in situ of skin of right upper limb, including shoulder: Secondary | ICD-10-CM | POA: Diagnosis not present

## 2024-05-11 DIAGNOSIS — L570 Actinic keratosis: Secondary | ICD-10-CM | POA: Diagnosis not present

## 2024-05-11 DIAGNOSIS — D2262 Melanocytic nevi of left upper limb, including shoulder: Secondary | ICD-10-CM | POA: Diagnosis not present

## 2024-05-14 DIAGNOSIS — M48062 Spinal stenosis, lumbar region with neurogenic claudication: Secondary | ICD-10-CM | POA: Diagnosis not present

## 2024-05-14 DIAGNOSIS — M5416 Radiculopathy, lumbar region: Secondary | ICD-10-CM | POA: Diagnosis not present

## 2024-06-03 DIAGNOSIS — M2242 Chondromalacia patellae, left knee: Secondary | ICD-10-CM | POA: Diagnosis not present

## 2024-06-03 DIAGNOSIS — M2241 Chondromalacia patellae, right knee: Secondary | ICD-10-CM | POA: Diagnosis not present

## 2024-06-03 DIAGNOSIS — M170 Bilateral primary osteoarthritis of knee: Secondary | ICD-10-CM | POA: Diagnosis not present

## 2024-06-15 DIAGNOSIS — M48062 Spinal stenosis, lumbar region with neurogenic claudication: Secondary | ICD-10-CM | POA: Diagnosis not present

## 2024-06-15 DIAGNOSIS — M5416 Radiculopathy, lumbar region: Secondary | ICD-10-CM | POA: Diagnosis not present

## 2024-06-22 DIAGNOSIS — D0461 Carcinoma in situ of skin of right upper limb, including shoulder: Secondary | ICD-10-CM | POA: Diagnosis not present

## 2024-06-22 DIAGNOSIS — D692 Other nonthrombocytopenic purpura: Secondary | ICD-10-CM | POA: Diagnosis not present

## 2024-07-06 DIAGNOSIS — E782 Mixed hyperlipidemia: Secondary | ICD-10-CM | POA: Diagnosis not present

## 2024-07-06 DIAGNOSIS — Z79899 Other long term (current) drug therapy: Secondary | ICD-10-CM | POA: Diagnosis not present

## 2024-07-09 ENCOUNTER — Other Ambulatory Visit: Payer: Self-pay | Admitting: Internal Medicine

## 2024-07-09 ENCOUNTER — Ambulatory Visit
Admission: RE | Admit: 2024-07-09 | Discharge: 2024-07-09 | Disposition: A | Discharge status: Home / Self Care | Source: Ambulatory Visit | Attending: Internal Medicine | Admitting: Internal Medicine

## 2024-07-09 DIAGNOSIS — I639 Cerebral infarction, unspecified: Secondary | ICD-10-CM | POA: Diagnosis not present

## 2024-07-09 DIAGNOSIS — Z125 Encounter for screening for malignant neoplasm of prostate: Secondary | ICD-10-CM | POA: Diagnosis not present

## 2024-07-09 DIAGNOSIS — R0789 Other chest pain: Secondary | ICD-10-CM | POA: Diagnosis not present

## 2024-07-09 DIAGNOSIS — G319 Degenerative disease of nervous system, unspecified: Secondary | ICD-10-CM | POA: Diagnosis not present

## 2024-07-09 DIAGNOSIS — M5416 Radiculopathy, lumbar region: Secondary | ICD-10-CM | POA: Diagnosis not present

## 2024-07-09 DIAGNOSIS — R739 Hyperglycemia, unspecified: Secondary | ICD-10-CM | POA: Diagnosis not present

## 2024-07-09 DIAGNOSIS — Z79899 Other long term (current) drug therapy: Secondary | ICD-10-CM | POA: Diagnosis not present

## 2024-07-09 DIAGNOSIS — Z23 Encounter for immunization: Secondary | ICD-10-CM | POA: Diagnosis not present

## 2024-07-09 DIAGNOSIS — R9082 White matter disease, unspecified: Secondary | ICD-10-CM | POA: Diagnosis not present

## 2024-07-09 DIAGNOSIS — R0602 Shortness of breath: Secondary | ICD-10-CM | POA: Diagnosis not present

## 2024-07-09 DIAGNOSIS — I480 Paroxysmal atrial fibrillation: Secondary | ICD-10-CM | POA: Diagnosis not present

## 2024-07-15 DIAGNOSIS — I2089 Other forms of angina pectoris: Secondary | ICD-10-CM | POA: Diagnosis not present

## 2024-07-16 DIAGNOSIS — I480 Paroxysmal atrial fibrillation: Secondary | ICD-10-CM | POA: Diagnosis not present

## 2024-07-16 DIAGNOSIS — I5033 Acute on chronic diastolic (congestive) heart failure: Secondary | ICD-10-CM | POA: Diagnosis not present

## 2024-07-16 DIAGNOSIS — I340 Nonrheumatic mitral (valve) insufficiency: Secondary | ICD-10-CM | POA: Diagnosis not present

## 2024-08-06 DIAGNOSIS — Z23 Encounter for immunization: Secondary | ICD-10-CM | POA: Diagnosis not present

## 2024-08-06 DIAGNOSIS — I5033 Acute on chronic diastolic (congestive) heart failure: Secondary | ICD-10-CM | POA: Diagnosis not present

## 2024-08-06 DIAGNOSIS — I340 Nonrheumatic mitral (valve) insufficiency: Secondary | ICD-10-CM | POA: Diagnosis not present

## 2024-08-18 DIAGNOSIS — J019 Acute sinusitis, unspecified: Secondary | ICD-10-CM | POA: Diagnosis not present

## 2024-08-18 DIAGNOSIS — H6991 Unspecified Eustachian tube disorder, right ear: Secondary | ICD-10-CM | POA: Diagnosis not present
